# Patient Record
Sex: Male | Born: 1970 | Race: Black or African American | Hispanic: No | Marital: Married | Smoking: Never smoker
Health system: Southern US, Community
[De-identification: ages and names within clinical notes are randomized; demographics above are authoritative.]

## PROBLEM LIST (undated history)

## (undated) DIAGNOSIS — F429 Obsessive-compulsive disorder, unspecified: Secondary | ICD-10-CM

## (undated) DIAGNOSIS — F329 Major depressive disorder, single episode, unspecified: Secondary | ICD-10-CM

## (undated) DIAGNOSIS — F909 Attention-deficit hyperactivity disorder, unspecified type: Secondary | ICD-10-CM

## (undated) DIAGNOSIS — F419 Anxiety disorder, unspecified: Secondary | ICD-10-CM

## (undated) DIAGNOSIS — F32A Depression, unspecified: Secondary | ICD-10-CM

## (undated) DIAGNOSIS — I10 Essential (primary) hypertension: Secondary | ICD-10-CM

## (undated) HISTORY — PX: KNEE SURGERY: SHX244

## (undated) HISTORY — PX: VASECTOMY: SHX75

---

## 2011-05-11 ENCOUNTER — Ambulatory Visit (INDEPENDENT_AMBULATORY_CARE_PROVIDER_SITE_OTHER): Payer: 59 | Admitting: Psychiatry

## 2011-05-11 DIAGNOSIS — F429 Obsessive-compulsive disorder, unspecified: Secondary | ICD-10-CM

## 2011-05-25 ENCOUNTER — Ambulatory Visit (HOSPITAL_COMMUNITY): Payer: 59 | Admitting: Behavioral Health

## 2011-06-01 ENCOUNTER — Encounter (HOSPITAL_COMMUNITY): Payer: 59 | Admitting: Psychiatry

## 2011-08-02 ENCOUNTER — Emergency Department (HOSPITAL_BASED_OUTPATIENT_CLINIC_OR_DEPARTMENT_OTHER)
Admission: EM | Admit: 2011-08-02 | Discharge: 2011-08-03 | Disposition: A | Discharge status: Home / Self Care | Payer: Managed Care, Other (non HMO) | Attending: Emergency Medicine | Admitting: Emergency Medicine

## 2011-08-02 ENCOUNTER — Encounter: Payer: Self-pay | Admitting: *Deleted

## 2011-08-02 DIAGNOSIS — F909 Attention-deficit hyperactivity disorder, unspecified type: Secondary | ICD-10-CM | POA: Insufficient documentation

## 2011-08-02 DIAGNOSIS — J029 Acute pharyngitis, unspecified: Secondary | ICD-10-CM

## 2011-08-02 DIAGNOSIS — F341 Dysthymic disorder: Secondary | ICD-10-CM | POA: Insufficient documentation

## 2011-08-02 DIAGNOSIS — Z79899 Other long term (current) drug therapy: Secondary | ICD-10-CM | POA: Insufficient documentation

## 2011-08-02 HISTORY — DX: Attention-deficit hyperactivity disorder, unspecified type: F90.9

## 2011-08-02 HISTORY — DX: Obsessive-compulsive disorder, unspecified: F42.9

## 2011-08-02 HISTORY — DX: Depression, unspecified: F32.A

## 2011-08-02 HISTORY — DX: Anxiety disorder, unspecified: F41.9

## 2011-08-02 HISTORY — DX: Major depressive disorder, single episode, unspecified: F32.9

## 2011-08-02 NOTE — ED Notes (Signed)
Pt c/o scratchy sore throat since 8pm tonight.  Pt has been on Zoloft for the past couple weeks and started a couple other psych meds approx 35month ago. Pt thinks his sore throat is a reaction from one of his meds.

## 2011-08-03 LAB — RAPID STREP SCREEN (MED CTR MEBANE ONLY): Streptococcus, Group A Screen (Direct): NEGATIVE

## 2011-08-03 NOTE — ED Provider Notes (Signed)
History     CSN: [id number]  Arrival date & time 08/02/11  2242   First MD Initiated Contact with Patient 08/03/11 0002      Chief Complaint  Patient presents with  . Sore Throat    (Consider location/radiation/quality/duration/timing/severity/associated sxs/prior treatment) HPI Comments: Patient is a pleasant 41 year old male with a history of anxiety, obsessive-compulsive disorder who presents with a sore throat which started approximately 4 hours ago. This was gradual in onset, constant pain, mild and not associated with fevers chills nausea vomiting shortness of breath or cough. He denies diarrhea, rashes, swelling of his face or legs. He states that he saw his psychiatrist earlier in the day and told that he was having some restlessness throughout the day because of not taking his medications with food. The restlessness has since stopped. He is concerned because the package insert for Zoloft states that he may have swelling or pain in her throat and should seek medical attention immediately.  Endorses 6 contact with his wife having a flu 10 days ago.  Patient is a 41 y.o. male presenting with pharyngitis. The history is provided by the patient.  Sore Throat Pertinent negatives include no chest pain.    Past Medical History  Diagnosis Date  . Anxiety and depression   . ADHD (attention deficit hyperactivity disorder)   . OCD (obsessive compulsive disorder)     Past Surgical History  Procedure Date  . Knee surgery   . Vasectomy     No family history on file.  History  Substance Use Topics  . Smoking status: Never Smoker   . Smokeless tobacco: Not on file  . Alcohol Use: No      Review of Systems  Constitutional: Negative for fever and chills.  HENT: Positive for sore throat. Negative for congestion, rhinorrhea, trouble swallowing, neck stiffness and voice change.   Respiratory: Negative for cough.   Cardiovascular: Negative for chest pain and leg swelling.    Gastrointestinal: Negative for nausea, vomiting and diarrhea.    Allergies  Review of patient's allergies indicates no known allergies.  Home Medications   Current Outpatient Rx  Name Route Sig Dispense Refill  . DIAZEPAM 5 MG PO TABS Oral Take 5 mg by mouth every 6 (six) hours as needed.      Marland Kitchen ESZOPICLONE 3 MG PO TABS Oral Take 3 mg by mouth at bedtime. Take immediately before bedtime     . GABAPENTIN 300 MG PO CAPS Oral Take 300 mg by mouth 3 (three) times daily.      Marland Kitchen GUANFACINE HCL ER 3 MG PO TB24 Oral Take 1 tablet by mouth 1 day or 1 dose.      Marland Kitchen LAMOTRIGINE 100 MG PO TABS Oral Take 100 mg by mouth daily.      . SERTRALINE HCL 100 MG PO TABS Oral Take 150 mg by mouth daily.        BP 140/85  Pulse 60  Temp(Src) 98.5 F (36.9 C) (Oral)  Resp 18  Ht 6\' 2"  (1.88 m)  Wt 230 lb (104.327 kg)  BMI 29.53 kg/m2  SpO2 99%  Physical Exam  Constitutional: He appears well-developed and well-nourished. No distress.  HENT:  Head: Normocephalic and atraumatic.  Right Ear: External ear normal.  Left Ear: External ear normal.  Mouth/Throat: Oropharynx is clear and moist. No oropharyngeal exudate.       No asymmetry, hypertrophy, exudate, erythema  Eyes: Conjunctivae are normal. Right eye exhibits no discharge. Left eye exhibits  no discharge. No scleral icterus.  Neck: Normal range of motion. Neck supple.  Cardiovascular: Normal rate.   Pulmonary/Chest: Effort normal and breath sounds normal. He has no wheezes. He has no rales.  Abdominal: Soft. There is no tenderness.       No hepatosplenomegaly  Musculoskeletal: He exhibits no edema and no tenderness.  Lymphadenopathy:    He has no cervical adenopathy.  Skin: He is not diaphoretic.    ED Course  Procedures (including critical care time)   Labs Reviewed  RAPID STREP SCREEN   No results found.   1. Pharyngitis       MDM  Patient is well-appearing without signs of pharyngitis or tonsillar abscess, vital signs are  normal with no fever or tachycardia or respiratory distress. Laboratory workup includes a negative rapid strep test.  Recommendations for treatment at home given, doubt Zoloft as reaction but more likely early pharyngitis.        Vida Roller, MD 08/03/11 [phone]

## 2011-12-20 ENCOUNTER — Encounter (HOSPITAL_COMMUNITY): Payer: Self-pay | Admitting: Psychiatry

## 2012-04-25 ENCOUNTER — Encounter (HOSPITAL_BASED_OUTPATIENT_CLINIC_OR_DEPARTMENT_OTHER): Payer: Self-pay | Admitting: *Deleted

## 2012-04-25 ENCOUNTER — Emergency Department (HOSPITAL_BASED_OUTPATIENT_CLINIC_OR_DEPARTMENT_OTHER): Payer: BC Managed Care – PPO

## 2012-04-25 ENCOUNTER — Emergency Department (HOSPITAL_BASED_OUTPATIENT_CLINIC_OR_DEPARTMENT_OTHER)
Admission: EM | Admit: 2012-04-25 | Discharge: 2012-04-26 | Disposition: A | Discharge status: Home / Self Care | Payer: BC Managed Care – PPO | Attending: Emergency Medicine | Admitting: Emergency Medicine

## 2012-04-25 DIAGNOSIS — I10 Essential (primary) hypertension: Secondary | ICD-10-CM | POA: Insufficient documentation

## 2012-04-25 DIAGNOSIS — F429 Obsessive-compulsive disorder, unspecified: Secondary | ICD-10-CM | POA: Insufficient documentation

## 2012-04-25 DIAGNOSIS — F909 Attention-deficit hyperactivity disorder, unspecified type: Secondary | ICD-10-CM | POA: Insufficient documentation

## 2012-04-25 HISTORY — DX: Essential (primary) hypertension: I10

## 2012-04-25 LAB — CBC WITH DIFFERENTIAL/PLATELET
Eosinophils Relative: 2 % (ref 0–5)
HCT: 40.4 % (ref 39.0–52.0)
Hemoglobin: 13.9 g/dL (ref 13.0–17.0)
Lymphocytes Relative: 39 % (ref 12–46)
Lymphs Abs: 1.8 10*3/uL (ref 0.7–4.0)
MCV: 82.8 fL (ref 78.0–100.0)
Monocytes Absolute: 0.3 10*3/uL (ref 0.1–1.0)
Neutro Abs: 2.4 10*3/uL (ref 1.7–7.7)
RBC: 4.88 MIL/uL (ref 4.22–5.81)
WBC: 4.6 10*3/uL (ref 4.0–10.5)

## 2012-04-25 LAB — URINALYSIS, ROUTINE W REFLEX MICROSCOPIC
Bilirubin Urine: NEGATIVE
Glucose, UA: NEGATIVE mg/dL
Ketones, ur: NEGATIVE mg/dL
Leukocytes, UA: NEGATIVE
Nitrite: NEGATIVE
Specific Gravity, Urine: 1.025 (ref 1.005–1.030)
pH: 6.5 (ref 5.0–8.0)

## 2012-04-25 LAB — GLUCOSE, CAPILLARY: Glucose-Capillary: 87 mg/dL (ref 70–99)

## 2012-04-25 MED ORDER — SODIUM CHLORIDE 0.9 % IV BOLUS (SEPSIS)
1000.0000 mL | Freq: Once | INTRAVENOUS | Status: AC
  Administered 2012-04-25: 1000 mL via INTRAVENOUS

## 2012-04-25 MED ORDER — ASPIRIN 81 MG PO CHEW
162.0000 mg | CHEWABLE_TABLET | Freq: Once | ORAL | Status: AC
  Administered 2012-04-25: 162 mg via ORAL
  Filled 2012-04-25: qty 2

## 2012-04-25 MED ORDER — KETOROLAC TROMETHAMINE 30 MG/ML IJ SOLN
30.0000 mg | Freq: Once | INTRAMUSCULAR | Status: AC
  Administered 2012-04-25: 30 mg via INTRAVENOUS
  Filled 2012-04-25: qty 1

## 2012-04-25 MED ORDER — METOCLOPRAMIDE HCL 5 MG/ML IJ SOLN
10.0000 mg | Freq: Once | INTRAMUSCULAR | Status: AC
  Administered 2012-04-25: 10 mg via INTRAVENOUS
  Filled 2012-04-25: qty 2

## 2012-04-25 NOTE — ED Notes (Signed)
Pt recently started on fanapt and since started bp has been elevated, priomary md started pt on medication (Azor) for 2 days just to get blood pressure down until patients body gets adjusted to fanapt.

## 2012-04-25 NOTE — ED Notes (Signed)
States he saw his MD today for headache and elevated blood pressure. He was started on Azor. He has had one pill and blood pressure remains elevated per pt.

## 2012-04-26 LAB — BASIC METABOLIC PANEL
CO2: 25 mEq/L (ref 19–32)
Calcium: 9.5 mg/dL (ref 8.4–10.5)
Chloride: 104 mEq/L (ref 96–112)
Creatinine, Ser: 1.3 mg/dL (ref 0.50–1.35)
Glucose, Bld: 89 mg/dL (ref 70–99)

## 2012-04-26 LAB — TROPONIN I: Troponin I: <0.3 ng/mL (ref <0.30)

## 2012-04-26 MED ORDER — IBUPROFEN 600 MG PO TABS: 600.0000 mg | ORAL_TABLET | Freq: Four times a day (QID) | ORAL | Status: DC | PRN

## 2012-04-26 NOTE — ED Provider Notes (Signed)
History     CSN: [id number]  Arrival date & time 04/25/12  2156   First MD Initiated Contact with Patient 04/25/12 2326      Chief Complaint  Patient presents with  . Headache    (Consider location/radiation/quality/duration/timing/severity/associated sxs/prior treatment) HPI Comments: Pt with hx of HTN and psychiatric conditions comes in with cc of headaches. Pt has no hx of headaches, states that since this morning he has been having a mild to moderate headache, dull, between 2 to 5 out of 10 that has been intermittent, with no specific precipitating factor. The headache is located in the frontal region, and has no specific aggravating or reliebing factors. He has no No nausea, vomiting, visual complains, seizures, altered mental status, loss of consciousness, new weakness, or numbness, no gait instability.  Pt is also having mild chest discomfort, mid sternal, sharp pain. This pain is also intermittent, will last for a few minutes and resolve spontaneously. The pais is worse with exertion, but not always related to exertion. There is no associated n/v/f/c/dizziness/lightheadedness.  Pt saw his pcp earlier today, he is aware of both of his sx. The chest pain is attributed to his new antipsychotic. He also had elevated BP, and was started on new meds.  Pt has HTN as the only risk factor, as he has no family hx of CAD and social hx is benign as well.   Patient is a 41 y.o. male presenting with headaches. The history is provided by the patient.  Headache  Pertinent negatives include no fever and no shortness of breath.    Past Medical History  Diagnosis Date  . Anxiety and depression   . ADHD (attention deficit hyperactivity disorder)   . OCD (obsessive compulsive disorder)   . Hypertension     Past Surgical History  Procedure Date  . Knee surgery   . Vasectomy     No family history on file.  History  Substance Use Topics  . Smoking status: Never Smoker   . Smokeless  tobacco: Not on file  . Alcohol Use: No      Review of Systems  Constitutional: Negative for fever, chills and activity change.  HENT: Negative for neck pain.   Eyes: Negative for visual disturbance.  Respiratory: Positive for chest tightness. Negative for cough and shortness of breath.   Cardiovascular: Negative for chest pain.  Gastrointestinal: Negative for abdominal distention.  Genitourinary: Negative for dysuria, enuresis and difficulty urinating.  Musculoskeletal: Negative for arthralgias.  Neurological: Positive for dizziness and headaches. Negative for light-headedness.  Psychiatric/Behavioral: Negative for confusion.    Allergies  Review of patient's allergies indicates no known allergies.  Home Medications   Current Outpatient Rx  Name Route Sig Dispense Refill  . AZOR PO Oral Take by mouth.    Marland Kitchen FOCALIN PO Oral Take by mouth.    . ILOPERIDONE 4 MG PO TABS Oral Take by mouth.    Marland Kitchen LISINOPRIL PO Oral Take by mouth.    Marland Kitchen DIAZEPAM 5 MG PO TABS Oral Take 5 mg by mouth every 6 (six) hours as needed.      Marland Kitchen ESZOPICLONE 3 MG PO TABS Oral Take 3 mg by mouth at bedtime. Take immediately before bedtime     . GABAPENTIN 300 MG PO CAPS Oral Take 300 mg by mouth 3 (three) times daily.      Marland Kitchen GUANFACINE HCL ER 3 MG PO TB24 Oral Take 1 tablet by mouth 1 day or 1 dose.      Marland Kitchen  IBUPROFEN 600 MG PO TABS Oral Take 1 tablet (600 mg total) by mouth every 6 (six) hours as needed for pain. 30 tablet 0  . LAMOTRIGINE 100 MG PO TABS Oral Take 100 mg by mouth daily.      . SERTRALINE HCL 100 MG PO TABS Oral Take 150 mg by mouth daily.        BP 135/72  Pulse 74  Temp 97.6 F (36.4 C) (Oral)  Resp 20  SpO2 100%  Physical Exam  Nursing note and vitals reviewed. Constitutional: He is oriented to person, place, and time. He appears well-developed.  HENT:  Head: Normocephalic and atraumatic.  Eyes: Conjunctivae normal and EOM are normal. Pupils are equal, round, and reactive to light.    Neck: Normal range of motion. Neck supple.  Cardiovascular: Normal rate, regular rhythm and normal heart sounds.   Pulmonary/Chest: Effort normal and breath sounds normal. No respiratory distress. He has no wheezes.  Abdominal: Soft. Bowel sounds are normal. He exhibits no distension. There is no tenderness. There is no rebound and no guarding.  Musculoskeletal: He exhibits no edema.  Neurological: He is alert and oriented to person, place, and time. No cranial nerve deficit. Coordination normal.  Skin: Skin is warm.    ED Course  Procedures (including critical care time)  Labs Reviewed  BASIC METABOLIC PANEL - Abnormal; Notable for the following:    GFR calc non Af Amer 67 (*)     GFR calc Af Amer 78 (*)     All other components within normal limits  CBC WITH DIFFERENTIAL  TROPONIN I  URINALYSIS, ROUTINE W REFLEX MICROSCOPIC  TROPONIN I  GLUCOSE, CAPILLARY   Dg Chest 2 View  04/26/2012  *RADIOLOGY REPORT*  Clinical Data: Elevated blood pressure and chest tightness.  CHEST - 2 VIEW  Comparison: None.  Findings: Two views of the chest demonstrate clear lungs. Heart and mediastinum are within normal limits.  The trachea is midline. Bony structures are intact.  IMPRESSION: No acute cardiopulmonary disease.   Original Report Authenticated By: Richarda Overlie, M.D.      1. Hypertension       MDM   Date: 04/26/2012  Rate: 57  Rhythm: normal sinus rhythm  QRS Axis: normal  Intervals: normal  ST/T Wave abnormalities: non specific t wave changes, 3, avf  Conduction Disutrbances: none  Narrative Interpretation: unremarkable  Differential diagnosis includes: ACS syndrome CHF exacerbation Valvular disorder Myocarditis Pericarditis Pericardial effusion Pneumonia Pleural effusion Pulmonary edema PE Anemia Musculoskeletal pain Primary headaches - including migrainous headaches, cluster headaches, tension headaches. ICH Carotid dissection Cavernous sinus  thrombosis Meningitis Encephalitis Sinusitis Tumor Vascular headaches AV malformation Brain aneurysm Muscular headaches  Pt comes in with cc of chest pain and headaches.  Chest pain - only 1 cardiac risk factor - HTN. Story at best is stable angina, with pcp aware, and outpatient f/u possible. Will get ACS labs, and ekg, and he will be asked to f/u with pcp if the symptoms persists.  The headaches have no red flags on hx of exam, and there is no thunderclap nature to it, and they are intermittent, moderate headaches. With BP not too elevated, and no hard evidence of ICH/SAH based on hx and exam , no CT ordered and we will not need to get LP.  HTN - outpatient management can be continued. No psych complains.           Derwood Kaplan, MD 04/26/12 [phone]

## 2012-04-26 NOTE — ED Notes (Signed)
I took cbg and got result of 29 mg./dcltr.

## 2013-06-06 IMAGING — CR DG CHEST 2V
2 series · 2 of 2 positions shown · non-contrast
Comparison: None.

CLINICAL DATA: Elevated blood pressure and chest tightness.

CHEST - 2 VIEW

[w chest pa]
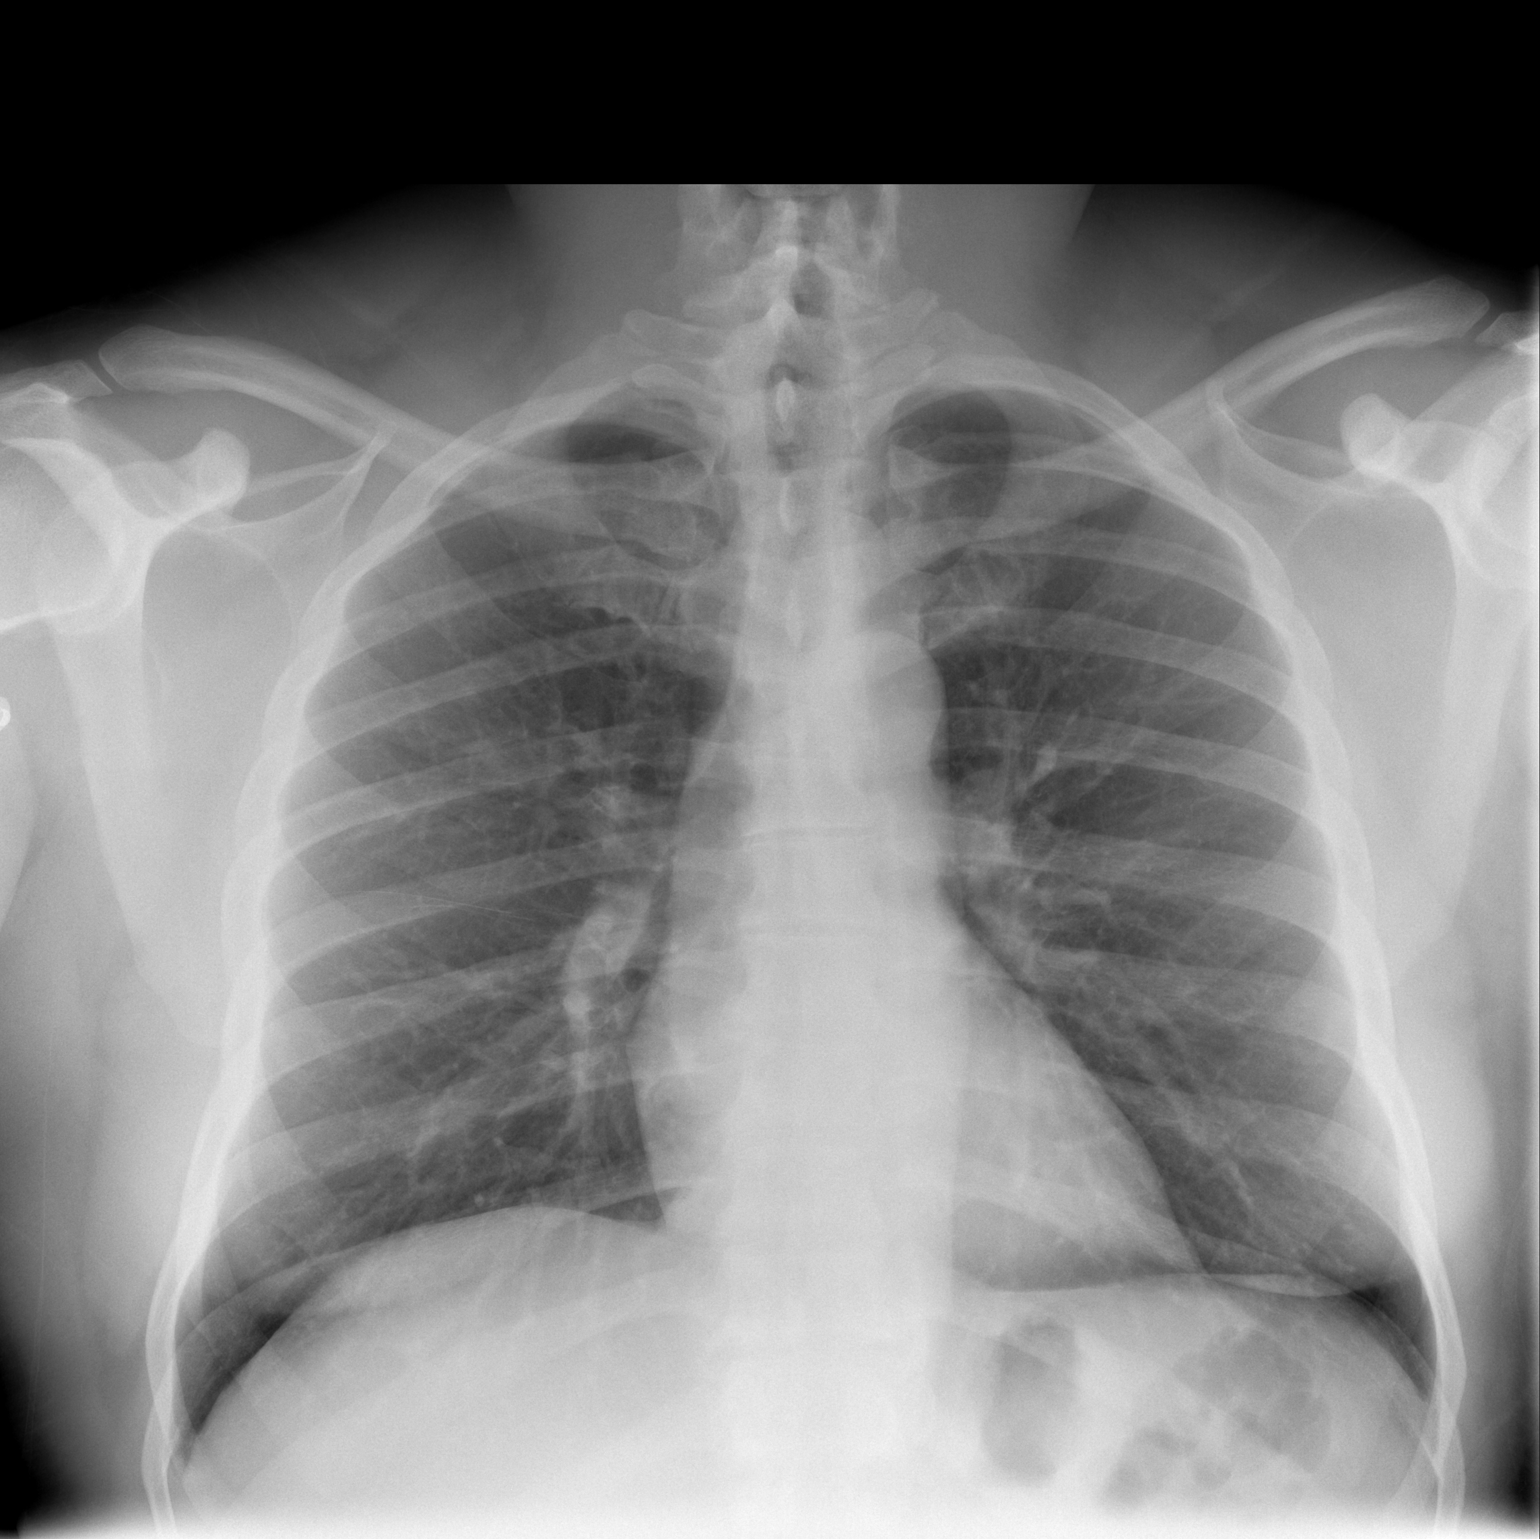

[w chest lat]
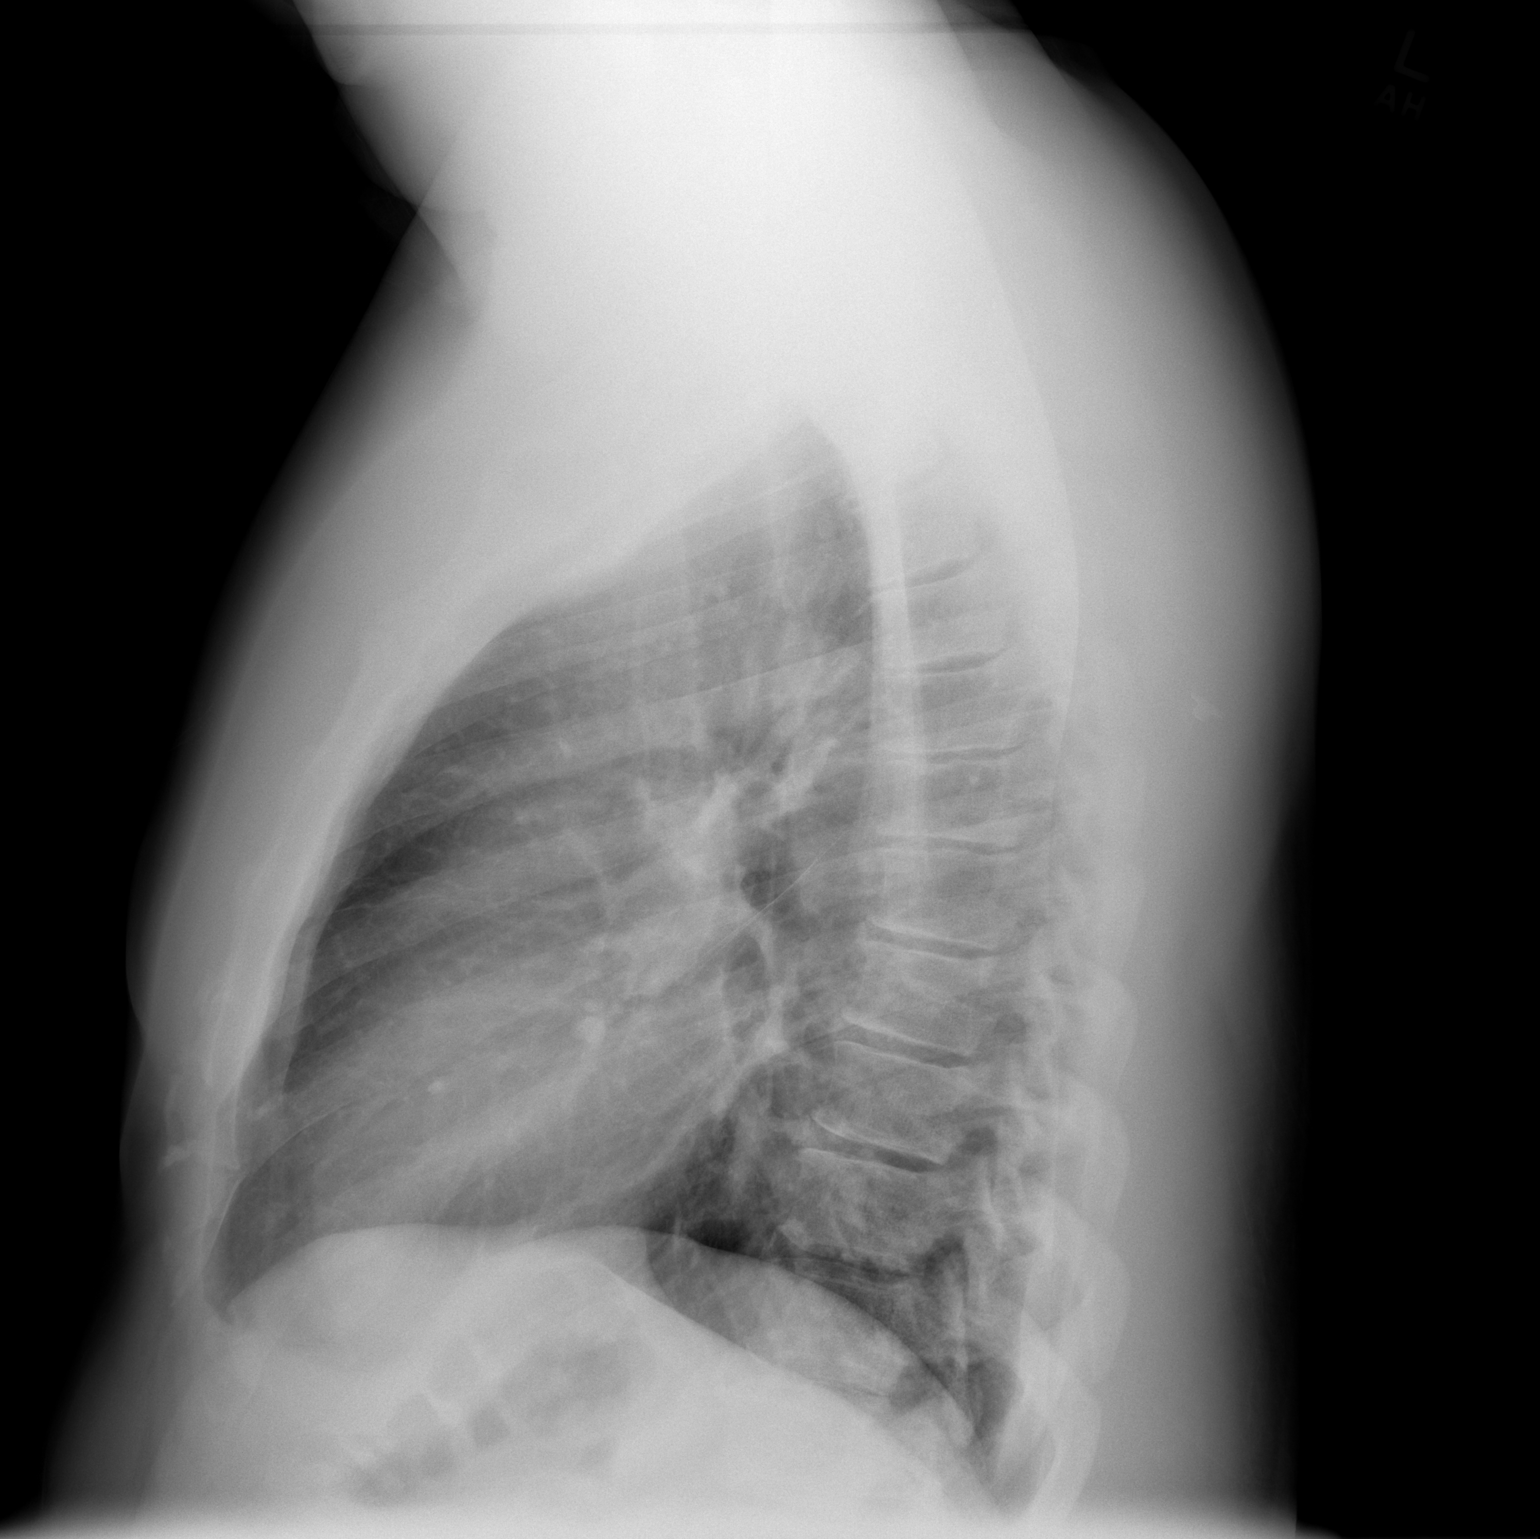

[2 of 2 positions shown; findings below may reference images not displayed]

FINDINGS: Two views of the chest demonstrate clear lungs. Heart and
mediastinum are within normal limits.  The trachea is midline.
Bony structures are intact.
IMPRESSION: No acute cardiopulmonary disease.

## 2016-10-06 ENCOUNTER — Encounter (HOSPITAL_COMMUNITY): Payer: Self-pay | Admitting: Emergency Medicine

## 2016-10-06 ENCOUNTER — Ambulatory Visit (HOSPITAL_COMMUNITY)
Admission: EM | Admit: 2016-10-06 | Discharge: 2016-10-06 | Disposition: A | Discharge status: Home / Self Care | Payer: 59 | Attending: Internal Medicine | Admitting: Internal Medicine

## 2016-10-06 DIAGNOSIS — A549 Gonococcal infection, unspecified: Secondary | ICD-10-CM | POA: Diagnosis not present

## 2016-10-06 DIAGNOSIS — B9689 Other specified bacterial agents as the cause of diseases classified elsewhere: Secondary | ICD-10-CM | POA: Diagnosis not present

## 2016-10-06 DIAGNOSIS — N10 Acute tubulo-interstitial nephritis: Secondary | ICD-10-CM | POA: Diagnosis not present

## 2016-10-06 DIAGNOSIS — R8290 Unspecified abnormal findings in urine: Secondary | ICD-10-CM

## 2016-10-06 DIAGNOSIS — R300 Dysuria: Secondary | ICD-10-CM | POA: Diagnosis present

## 2016-10-06 LAB — POCT URINALYSIS DIP (DEVICE)
Bilirubin Urine: NEGATIVE
GLUCOSE, UA: NEGATIVE mg/dL
Hgb urine dipstick: NEGATIVE
KETONES UR: NEGATIVE mg/dL
Nitrite: NEGATIVE
Protein, ur: NEGATIVE mg/dL
SPECIFIC GRAVITY, URINE: 1.02 (ref 1.005–1.030)
Urobilinogen, UA: 1 mg/dL (ref 0.0–1.0)
pH: 6 (ref 5.0–8.0)

## 2016-10-06 MED ORDER — CIPROFLOXACIN HCL 500 MG PO TABS: 500.0000 mg | ORAL_TABLET | Freq: Two times a day (BID) | ORAL | 0 refills | Status: AC

## 2016-10-06 MED ORDER — PHENAZOPYRIDINE HCL 100 MG PO TABS: 100.0000 mg | ORAL_TABLET | Freq: Three times a day (TID) | ORAL | 0 refills | Status: DC | PRN

## 2016-10-06 NOTE — ED Triage Notes (Signed)
Pt complains of burning with urination for the last four days.  Pt denies any discharge or abdominal pain, but does report some mild lower back pain. Pt denies frequency or urgency and no fever.

## 2016-10-06 NOTE — ED Provider Notes (Signed)
CSN: [id number]     Arrival date & time 10/06/16  1836 History   None    Chief Complaint  Patient presents with  . burning with urination   (Consider location/radiation/quality/duration/timing/severity/associated sxs/prior Treatment) 46 y.o. male presents with pain with urination  X 3 days. Condition is acute, persistent and worsening in nature. Condition is made better by nothing. Condition is made worse by nothing. Patient denies any relief from nothing prior to there arrival at this facility. Patient denies any fever, increased frequency, penile discharge or foul orders. Patient states he has previously had UTI after he has taken a bath. Patient denies any STI encounter but requests testing at this time. Patient made aware that he will be called for treatment if C&S comes back or positive STI.        Past Medical History:  Diagnosis Date  . ADHD (attention deficit hyperactivity disorder)   . Anxiety and depression   . Hypertension   . OCD (obsessive compulsive disorder)    Past Surgical History:  Procedure Laterality Date  . KNEE SURGERY    . VASECTOMY     History reviewed. No pertinent family history. Social History  Substance Use Topics  . Smoking status: Never Smoker  . Smokeless tobacco: Never Used  . Alcohol use No    Review of Systems  Constitutional: Negative for chills and fever.  HENT: Negative for ear pain and sore throat.   Eyes: Negative for pain and visual disturbance.  Respiratory: Negative for cough and shortness of breath.   Cardiovascular: Negative for chest pain and palpitations.  Gastrointestinal: Negative for abdominal pain and vomiting.  Genitourinary: Negative for hematuria.  Musculoskeletal: Negative for arthralgias and back pain.  Skin: Negative for color change and rash.  Neurological: Negative for seizures and syncope.  All other systems reviewed and are negative.   Allergies  Patient has no known allergies.  Home Medications   Prior  to Admission medications   Medication Sig Start Date End Date Taking? Authorizing Provider  Amlodipine-Olmesartan (AZOR PO) Take by mouth.   Yes Historical Provider, MD  DULoxetine (CYMBALTA) 60 MG capsule Take 60 mg by mouth 2 (two) times daily.   Yes Historical Provider, MD  valsartan (DIOVAN) 320 MG tablet Take 320 mg by mouth daily.   Yes Historical Provider, MD  ciprofloxacin (CIPRO) 500 MG tablet Take 1 tablet (500 mg total) by mouth every 12 (twelve) hours. 10/06/16 10/20/16  Alene Mires, NP  Dexmethylphenidate HCl (FOCALIN PO) Take by mouth.    Historical Provider, MD  diazepam (VALIUM) 5 MG tablet Take 5 mg by mouth every 6 (six) hours as needed.      Historical Provider, MD  Eszopiclone 3 MG TABS Take 3 mg by mouth at bedtime. Take immediately before bedtime     Historical Provider, MD  gabapentin (NEURONTIN) 300 MG capsule Take 300 mg by mouth 3 (three) times daily.      Historical Provider, MD  GuanFACINE HCl (INTUNIV) 3 MG TB24 Take 1 tablet by mouth 1 day or 1 dose.      Historical Provider, MD  ibuprofen (ADVIL,MOTRIN) 600 MG tablet Take 1 tablet (600 mg total) by mouth every 6 (six) hours as needed for pain. 04/26/12   Derwood Kaplan, MD  iloperidone (FANAPT) 4 MG TABS Take by mouth.    Historical Provider, MD  lamoTRIgine (LAMICTAL) 100 MG tablet Take 100 mg by mouth daily.      Historical Provider, MD  LISINOPRIL PO Take  by mouth.    Historical Provider, MD  phenazopyridine (PYRIDIUM) 100 MG tablet Take 1 tablet (100 mg total) by mouth 3 (three) times daily as needed for pain. 10/06/16   Alene MiresJennifer C , NP  sertraline (ZOLOFT) 100 MG tablet Take 150 mg by mouth daily.      Historical Provider, MD   Meds Ordered and Administered this Visit  Medications - No data to display  BP 148/92 (BP Location: Right Arm)   Pulse 80   Temp 98.1 F (36.7 C) (Oral)   SpO2 100%  No data found.   Physical Exam  Constitutional: He is oriented to person, place, and time. He  appears well-developed and well-nourished.  HENT:  Head: Normocephalic.  Neck: Normal range of motion.  Pulmonary/Chest: Effort normal.  Musculoskeletal: Normal range of motion.  Neurological: He is alert and oriented to person, place, and time.  Skin: Skin is dry.  Psychiatric: He has a normal mood and affect.  Nursing note and vitals reviewed.   Urgent Care Course     Procedures (including critical care time)  Labs Review Labs Reviewed  POCT URINALYSIS DIP (DEVICE) - Abnormal; Notable for the following:       Result Value   Leukocytes, UA SMALL (*)    All other components within normal limits  URINE CULTURE  URINE CYTOLOGY ANCILLARY ONLY    Imaging Review No results found.          MDM   1. Acute pyelonephritis       Alene MiresJennifer C , NP 10/06/16 1941

## 2016-10-08 LAB — URINE CYTOLOGY ANCILLARY ONLY
Chlamydia: NEGATIVE
NEISSERIA GONORRHEA: POSITIVE — AB
TRICH (WINDOWPATH): NEGATIVE

## 2016-10-09 ENCOUNTER — Ambulatory Visit (HOSPITAL_COMMUNITY)
Admission: EM | Admit: 2016-10-09 | Discharge: 2016-10-09 | Disposition: A | Discharge status: Home / Self Care | Payer: 59 | Attending: Internal Medicine | Admitting: Internal Medicine

## 2016-10-09 ENCOUNTER — Encounter (HOSPITAL_COMMUNITY): Payer: Self-pay | Admitting: Family Medicine

## 2016-10-09 DIAGNOSIS — A549 Gonococcal infection, unspecified: Secondary | ICD-10-CM

## 2016-10-09 MED ORDER — CEFTRIAXONE SODIUM 250 MG IJ SOLR
INTRAMUSCULAR | Status: AC
Start: 2016-10-09 — End: 2016-10-09
  Filled 2016-10-09: qty 250

## 2016-10-09 MED ORDER — CEFTRIAXONE SODIUM 250 MG IJ SOLR
250.0000 mg | Freq: Once | INTRAMUSCULAR | Status: AC
  Administered 2016-10-09: 250 mg via INTRAMUSCULAR

## 2016-10-09 MED ORDER — STERILE WATER FOR INJECTION IJ SOLN
INTRAMUSCULAR | Status: AC
  Filled 2016-10-09: qty 10

## 2016-10-09 MED ORDER — AZITHROMYCIN 250 MG PO TABS
ORAL_TABLET | ORAL | Status: AC
  Filled 2016-10-09: qty 4

## 2016-10-09 MED ORDER — AZITHROMYCIN 250 MG PO TABS
1000.0000 mg | ORAL_TABLET | Freq: Once | ORAL | Status: AC
  Administered 2016-10-09: 1000 mg via ORAL

## 2016-10-09 NOTE — ED Triage Notes (Signed)
Pt here for STD treatment  

## 2016-10-10 LAB — URINE CULTURE

## 2017-01-25 ENCOUNTER — Emergency Department (HOSPITAL_COMMUNITY): Payer: 59

## 2017-01-25 ENCOUNTER — Encounter (HOSPITAL_COMMUNITY): Payer: Self-pay | Admitting: Emergency Medicine

## 2017-01-25 ENCOUNTER — Emergency Department (HOSPITAL_COMMUNITY)
Admission: EM | Admit: 2017-01-25 | Discharge: 2017-01-25 | Disposition: A | Discharge status: Home / Self Care | Payer: 59 | Attending: Emergency Medicine | Admitting: Emergency Medicine

## 2017-01-25 DIAGNOSIS — Y9367 Activity, basketball: Secondary | ICD-10-CM | POA: Diagnosis not present

## 2017-01-25 DIAGNOSIS — Y999 Unspecified external cause status: Secondary | ICD-10-CM | POA: Insufficient documentation

## 2017-01-25 DIAGNOSIS — Y929 Unspecified place or not applicable: Secondary | ICD-10-CM | POA: Diagnosis not present

## 2017-01-25 DIAGNOSIS — F909 Attention-deficit hyperactivity disorder, unspecified type: Secondary | ICD-10-CM | POA: Diagnosis not present

## 2017-01-25 DIAGNOSIS — S63272A Dislocation of unspecified interphalangeal joint of right middle finger, initial encounter: Secondary | ICD-10-CM | POA: Insufficient documentation

## 2017-01-25 DIAGNOSIS — S61212A Laceration without foreign body of right middle finger without damage to nail, initial encounter: Secondary | ICD-10-CM | POA: Diagnosis present

## 2017-01-25 DIAGNOSIS — S63259A Unspecified dislocation of unspecified finger, initial encounter: Secondary | ICD-10-CM

## 2017-01-25 DIAGNOSIS — I10 Essential (primary) hypertension: Secondary | ICD-10-CM | POA: Diagnosis not present

## 2017-01-25 DIAGNOSIS — S61209A Unspecified open wound of unspecified finger without damage to nail, initial encounter: Secondary | ICD-10-CM

## 2017-01-25 DIAGNOSIS — W230XXA Caught, crushed, jammed, or pinched between moving objects, initial encounter: Secondary | ICD-10-CM | POA: Diagnosis not present

## 2017-01-25 DIAGNOSIS — Z79899 Other long term (current) drug therapy: Secondary | ICD-10-CM | POA: Insufficient documentation

## 2017-01-25 MED ORDER — CEFAZOLIN SODIUM-DEXTROSE 1-4 GM/50ML-% IV SOLN
1.0000 g | Freq: Once | INTRAVENOUS | Status: AC
  Administered 2017-01-25: 1 g via INTRAVENOUS
  Filled 2017-01-25: qty 50

## 2017-01-25 MED ORDER — DOXYCYCLINE HYCLATE 100 MG PO CAPS: 100.0000 mg | ORAL_CAPSULE | Freq: Two times a day (BID) | ORAL | 0 refills | Status: AC

## 2017-01-25 MED ORDER — IBUPROFEN 200 MG PO TABS
400.0000 mg | ORAL_TABLET | Freq: Once | ORAL | Status: AC
  Administered 2017-01-25: 400 mg via ORAL
  Filled 2017-01-25: qty 2

## 2017-01-25 MED ORDER — TETANUS-DIPHTH-ACELL PERTUSSIS 5-2.5-18.5 LF-MCG/0.5 IM SUSP
0.5000 mL | Freq: Once | INTRAMUSCULAR | Status: DC
  Filled 2017-01-25: qty 0.5

## 2017-01-25 MED ORDER — BUPIVACAINE HCL 0.5 % IJ SOLN: 50.0000 mL | Freq: Once | INTRAMUSCULAR | Status: DC

## 2017-01-25 MED ORDER — BUPIVACAINE HCL (PF) 0.5 % IJ SOLN: 30.0000 mL | Freq: Once | INTRAMUSCULAR | Status: DC

## 2017-01-25 MED ORDER — ACETAMINOPHEN 325 MG PO TABS
650.0000 mg | ORAL_TABLET | Freq: Once | ORAL | Status: AC
  Administered 2017-01-25: 650 mg via ORAL
  Filled 2017-01-25: qty 2

## 2017-01-25 MED ORDER — LIDOCAINE HCL (PF) 1 % IJ SOLN
30.0000 mL | Freq: Once | INTRAMUSCULAR | Status: DC
  Filled 2017-01-25: qty 30

## 2017-01-25 MED ORDER — HYDROCODONE-ACETAMINOPHEN 5-325 MG PO TABS: ORAL_TABLET | ORAL | 0 refills | Status: DC

## 2017-01-25 NOTE — ED Notes (Signed)
Surgeon in room  °

## 2017-01-25 NOTE — Discharge Instructions (Signed)
Take vicodin for breakthrough pain, do not drink alcohol, drive, care for children or do other critical tasks while taking vicodin.  If you see signs of infection (warmth, redness, tenderness, pus, sharp increase in pain, fever, red streaking) immediately return to the emergency department.

## 2017-01-25 NOTE — ED Notes (Signed)
IV placed on admission and removed now. 22 g left hand.

## 2017-01-25 NOTE — Consult Note (Signed)
Reason for Consult: Right middle finger PIP dislocation Referring Physician: ER staff  Adam Luna is an 46 y.o. male.  HPI: patient presents with a open PIP dislocation.  Patient presents for evaluation and treatment of the of their upper extremity predicament. The patient denies neck, back, chest or  abdominal pain. The patient notes that they have no lower extremity problems. The patients primary complaint is noted. We are planning surgical care pathway for the upper extremity.  Past Medical History:  Diagnosis Date  . ADHD (attention deficit hyperactivity disorder)   . Anxiety and depression   . Hypertension   . OCD (obsessive compulsive disorder)     Past Surgical History:  Procedure Laterality Date  . KNEE SURGERY    . VASECTOMY      No family history on file.  Social History:  reports that he has never smoked. He has never used smokeless tobacco. He reports that he does not drink alcohol or use drugs.  Allergies: No Known Allergies  Medications: I have reviewed the patient's current medications.  No results found for this or any previous visit (from the past 48 hour(s)).  Dg Finger Middle Right  Result Date: 01/25/2017 CLINICAL DATA:  Injury EXAM: RIGHT MIDDLE FINGER 2+V COMPARISON:  None. FINDINGS: There is complete dorsal dislocation of the middle phalanx with respect to the proximal phalanx at the PIP joint. There is associated gas in the soft tissues. No fracture. IMPRESSION: PIP joint dislocation. There is nonspecific gas in the soft tissues. Correlate clinically as for the presence of a soft tissue injury. Electronically Signed   By: Jolaine Click M.D.   On: 01/25/2017 14:31    Review of Systems  Eyes: Negative.   Respiratory: Negative.   Cardiovascular: Negative.   Gastrointestinal: Negative.   Genitourinary: Negative.   Neurological: Negative.    Blood pressure (!) 124/91, pulse (!) 54, temperature 98.2 F (36.8 C), temperature source Oral, resp. rate 18,  height 6\' 3"  (1.905 m), weight 102.1 kg (225 lb), SpO2 100 %. Physical Exam Open right middle finger PIP dislocation with soft tissue trauma ulnarly  Intact refill  The patient is alert and oriented in no acute distress. The patient complains of pain in the affected upper extremity.  The patient is noted to have a normal HEENT exam. Lung fields show equal chest expansion and no shortness of breath. Abdomen exam is nontender without distention. Lower extremity examination does not show any fracture dislocation or blood clot symptoms. Pelvis is stable and the neck and back are stable and nontender.  Assessment/Plan: Open right middle finger PIP dislocation  We are planning surgery for your upper extremity. The risk and benefits of surgery to include risk of bleeding, infection, anesthesia,  damage to normal structures and failure of the surgery to accomplish its intended goals of relieving symptoms and restoring function have been discussed in detail. With this in mind we plan to proceed. I have specifically discussed with the patient the pre-and postoperative regime and the dos and don'ts and risk and benefits in great detail. Risk and benefits of surgery also include risk of dystrophy(CRPS), chronic nerve pain, failure of the healing process to go onto completion and other inherent risks of surgery The relavent the pathophysiology of the disease/injury process, as well as the alternatives for treatment and postoperative course of action has been discussed in great detail with the patient who desires to proceed.  We will do everything in our power to help you (the patient) restore  function to the upper extremity. It is a pleasure to see this patient today.  see full surgical note #[id number]  [name], M 01/25/2017, 6:46 PM

## 2017-01-25 NOTE — ED Triage Notes (Addendum)
Pt verbalizes jammed finger while playing basketball 30 minutes ago and "the bone came out of the skin"; injury to right middle finger.

## 2017-01-25 NOTE — ED Notes (Signed)
Pt did not want any ice for finger injury.

## 2017-01-25 NOTE — ED Provider Notes (Signed)
[facility] Provider Note   CSN: [id number] Arrival date & time: 01/25/17  1351     History   Chief Complaint Chief Complaint  Patient presents with  . Finger Injury      HPI   Blood pressure 118/76, pulse 83, temperature 98.1 F (36.7 C), temperature source Oral, resp. rate 18, height 6\' 3"  (1.905 m), weight 102.1 kg (225 lb), SpO2 100 %.  Adam Luna is a 46 y.o. male complaining of Pain and reduced range of motion with associated laceration to right third digit after jamming it while pain playing basketball just prior to arrival. He states that initially the bone was out of the finger but he push the bone back in underneath the skin. Last tetanus shot is unknown, pain is 7 out of 10. He is right-hand-dominant and does not significantly use his hands for work but he does type of work. He denies numbness or paresthesia. No immunosuppression.   Past Medical History:  Diagnosis Date  . ADHD (attention deficit hyperactivity disorder)   . Anxiety and depression   . Hypertension   . OCD (obsessive compulsive disorder)     There are no active problems to display for this patient.   Past Surgical History:  Procedure Laterality Date  . KNEE SURGERY    . VASECTOMY         Home Medications    Prior to Admission medications   Medication Sig Start Date End Date Taking? Authorizing Provider  Amlodipine-Olmesartan (AZOR PO) Take by mouth.    [provider]  Dexmethylphenidate HCl (FOCALIN PO) Take by mouth.    [provider]  diazepam (VALIUM) 5 MG tablet Take 5 mg by mouth every 6 (six) hours as needed.      [provider]  doxycycline (VIBRAMYCIN) 100 MG capsule Take 1 capsule (100 mg total) by mouth 2 (two) times daily. 01/25/17 02/04/17  , Joni Reining, PA-C  DULoxetine (CYMBALTA) 60 MG capsule Take 60 mg by mouth 2 (two) times daily.    [provider]  Eszopiclone 3 MG TABS Take 3 mg by mouth at bedtime. Take immediately  before bedtime     [provider]  gabapentin (NEURONTIN) 300 MG capsule Take 300 mg by mouth 3 (three) times daily.      [provider]  GuanFACINE HCl (INTUNIV) 3 MG TB24 Take 1 tablet by mouth 1 day or 1 dose.      [provider]  HYDROcodone-acetaminophen (NORCO/VICODIN) 5-325 MG tablet Take 1-2 tablets by mouth every 6 hours as needed for pain. 01/25/17   , Joni Reining, PA-C  ibuprofen (ADVIL,MOTRIN) 600 MG tablet Take 1 tablet (600 mg total) by mouth every 6 (six) hours as needed for pain. 04/26/12   Derwood Kaplan, MD  iloperidone (FANAPT) 4 MG TABS Take by mouth.    [provider]  lamoTRIgine (LAMICTAL) 100 MG tablet Take 100 mg by mouth daily.      [provider]  LISINOPRIL PO Take by mouth.    [provider]  phenazopyridine (PYRIDIUM) 100 MG tablet Take 1 tablet (100 mg total) by mouth 3 (three) times daily as needed for pain. 10/06/16   Alene Mires, NP  sertraline (ZOLOFT) 100 MG tablet Take 150 mg by mouth daily.      [provider]  valsartan (DIOVAN) 320 MG tablet Take 320 mg by mouth daily.    [provider]    Family History No family history on file.  Social History Social History  Substance Use Topics  . Smoking status: Never Smoker  . Smokeless tobacco: Never Used  . Alcohol use No     Allergies   Patient has no known allergies.   Review of Systems Review of Systems  A complete review of systems was obtained and all systems are negative except as noted in the HPI and PMH.    Physical Exam Updated Vital Signs BP (!) 133/96   Pulse 98   Temp 98.1 F (36.7 C) (Oral)   Resp 16   Ht 6\' 3"  (1.905 m)   Wt 102.1 kg (225 lb)   SpO2 100%   BMI 28.12 kg/m   Physical Exam  Constitutional: He is oriented to person, place, and time. He appears well-developed and well-nourished. No distress.  HENT:  Head: Normocephalic and atraumatic.  Mouth/Throat: Oropharynx is  clear and moist.  Eyes: Conjunctivae and EOM are normal. Pupils are equal, round, and reactive to light.  Neck: Normal range of motion.  Cardiovascular: Normal rate, regular rhythm and intact distal pulses.   Pulmonary/Chest: Effort normal and breath sounds normal.  Abdominal: Soft. There is no tenderness.  Musculoskeletal:  1.5 cm stellate laceration on the ulnar aspect of the right third digit PIP. No bone exposed. Obvious deformity, distally neurovascularly intact.  Neurological: He is alert and oriented to person, place, and time.  Skin: He is not diaphoretic.  Psychiatric: He has a normal mood and affect.  Nursing note and vitals reviewed.        ED Treatments / Results  Labs (all labs ordered are listed, but only abnormal results are displayed) Labs Reviewed - No data to display  EKG  EKG Interpretation None       Radiology Dg Finger Middle Right  Result Date: 01/25/2017 CLINICAL DATA:  Injury EXAM: RIGHT MIDDLE FINGER 2+V COMPARISON:  None. FINDINGS: There is complete dorsal dislocation of the middle phalanx with respect to the proximal phalanx at the PIP joint. There is associated gas in the soft tissues. No fracture. IMPRESSION: PIP joint dislocation. There is nonspecific gas in the soft tissues. Correlate clinically as for the presence of a soft tissue injury. Electronically Signed   By: Jolaine Click M.D.   On: 01/25/2017 14:31    Procedures Procedures (including critical care time)  Medications Ordered in ED Medications  Tdap (BOOSTRIX) injection 0.5 mL (0.5 mLs Intramuscular Not Given 01/25/17 1609)  lidocaine (PF) (XYLOCAINE) 1 % injection 30 mL (not administered)  ceFAZolin (ANCEF) IVPB 1 g/50 mL premix (0 g Intravenous Stopped 01/25/17 1707)     Initial Impression / Assessment and Plan / ED Course  I have reviewed the triage vital signs and the nursing notes.  Pertinent labs & imaging results that were available during my care of the patient were  reviewed by me and considered in my medical decision making (see chart for details).     Vitals:   01/25/17 1409 01/25/17 1528 01/25/17 1609  BP: 118/76 (!) 132/103 (!) 133/96  Pulse: 83 (!) 59 98  Resp: 18 14 16   Temp: 98.1 F (36.7 C)    TempSrc: Oral    SpO2: 100% 100% 100%  Weight: 102.1 kg (225 lb)    Height: 6\' 3"  (1.905 m)      Medications  Tdap (BOOSTRIX) injection 0.5 mL (0.5 mLs Intramuscular Not Given 01/25/17 1609)  lidocaine (PF) (XYLOCAINE) 1 % injection 30 mL (not administered)  ceFAZolin (ANCEF) IVPB 1 g/50 mL premix (0 g  Intravenous Stopped 01/25/17 1707)    Adam Luna is 46 y.o. male presenting with Dislocation to the right third PIP. This was open. He self reduced the bone underneath the skin but did not reduce the dislocation. Patient otherwise healthy, distally Norvasc and intact. Patient given Ancef and will discuss with Dr. Butler DenmarkGrammig.  Dr. Butler DenmarkGrammig and would like to perform the reduction and manage this wound primarily. He will come to the ED. Patient discharged with 40 Vicodin at Dr. Butler DenmarkGrammig makes request.  Evaluation does not show pathology that would require ongoing emergent intervention or inpatient treatment. Pt is hemodynamically stable and mentating appropriately. Discussed findings and plan with patient/guardian, who agrees with care plan. All questions answered. Return precautions discussed and outpatient follow up given.     Final Clinical Impressions(s) / ED Diagnoses   Final diagnoses:  Open dislocation of phalanx of hand, initial encounter    New Prescriptions New Prescriptions   DOXYCYCLINE (VIBRAMYCIN) 100 MG CAPSULE    Take 1 capsule (100 mg total) by mouth 2 (two) times daily.   HYDROCODONE-ACETAMINOPHEN (NORCO/VICODIN) 5-325 MG TABLET    Take 1-2 tablets by mouth every 6 hours as needed for pain.     Wynetta Emeryisciotta, , Cordelia Poche-C 01/25/17 1739    Alvira MondaySchlossman, Erin, MD 01/26/17 [phone]

## 2017-01-28 NOTE — Op Note (Signed)
[name]:  Greek, Wladyslaw                ACCOUNT NO.:  [id number]  MEDICAL RECORD NO.:  [id number]  LOCATION:                                 FACILITY:  PHYSICIAN:  Dionne AnoWilliam M. Amanda PeaGramig, M.D.     DATE OF BIRTH:  DATE OF PROCEDURE:  01/25/2017 DATE OF DISCHARGE:                              OPERATIVE REPORT   PREOPERATIVE DIAGNOSIS:  Right middle finger open proximal interphalangeal dislocation.  POSTOPERATIVE DIAGNOSIS:  Right middle finger open proximal interphalangeal dislocation.  PROCEDURE: 1. Irrigation and debridement, open PIP dislocation, including skin,     subcutaneous tissue, and bone. 2. Ulnar digital nerve neurolysis extensive under 4.5 expanded loupe     magnification. 3. Open treatment, PIP dislocation, right middle finger. 4. 4 view radiographic series performed, examined, and interpreted by     myself; right middle finger.  SURGEON:  Dionne AnoWilliam M. Amanda PeaGramig, M.D.  ASSISTANT:  None.  COMPLICATIONS:  None.  ANESTHESIA:  Intermetacarpal block.  DESCRIPTION OF PROCEDURE:  The patient was taken to the procedure suite and consented, underwent smooth induction of intermetacarpal block by myself.  Following this, he was prepped and draped with 2 separate Betadine scrub and paints.  I then performed isolation of sterile field. Time-out was observed and the patient underwent irrigation and debridement of skin, subcutaneous tissue, bone, tendon, and associated soft tissue structure.  He had injury to the A3 pulley and I resected portions of the A3 pulley.  I irrigated with greater than 2 L of saline.  Following this, I then very carefully and cautiously performed ulnar digital nerve neurolysis.  This nerve was intact without abnormality, but was significantly contused and bruised.  Once this was completed, I then performed open relocation of the joint. The joint sat well.  The volar plate was stable, but disrupted.  The patient demonstrated full range of motion.  I was  pleased with this and the findings.  I brought in x-ray and performed 4 view radiographic series performed, examined, and interpreted by myself, which looked excellent.  I was pleased with this and the findings.  Following this, we irrigated additionally and closed the wound with chromic suture.  I placed him in a sterile dressing of Adaptic, Xeroform, and following this placed a dorsal blocking splint.  He tolerated this well.  There were no complicating features.  Following placement of the splint, I took final x-rays.  He tolerated the procedure well.  Per my instruction, he will be on Cipro 500 mg 1 p.o. b.i.d. x10 days. I have discussed with him elevation, range of motion, and prevention of hyperextension to the digit.  I would like to see him in 8-10 days and have a therapy appointment immediately following.  He was given Norco p.r.n. pain.  These notes were discussed and all questions encouraged and answered.  I did ask that he receive an additional 1 g of Ancef in addition to the 1 g he received preoperatively.  It was a pleasure to see him today, I wish him best for the future.     Dionne AnoWilliam M. Amanda PeaGramig, M.D.   ______________________________ Dionne AnoWilliam M. Amanda PeaGramig, M.D.    [facility]WMG/MEDQ  D:  01/25/2017  T:  01/25/2017  Job:  [id number]

## 2018-03-08 IMAGING — DX DG FINGER MIDDLE 2+V*R*
3 series · 3 of 3 positions shown · non-contrast
Comparison: None.

CLINICAL DATA: Injury

EXAM:
RIGHT MIDDLE FINGER 2+V

[finger ap]
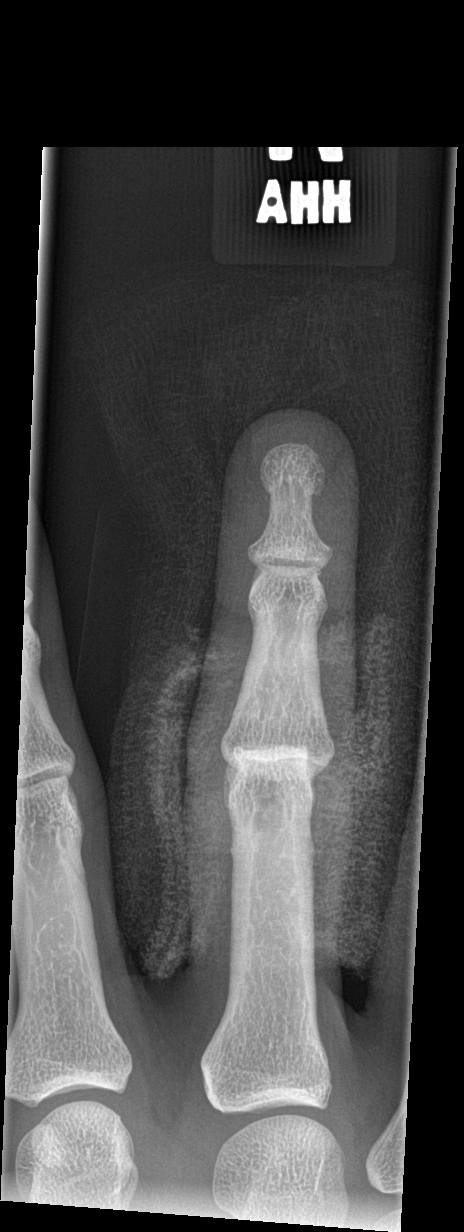

[finger obl]
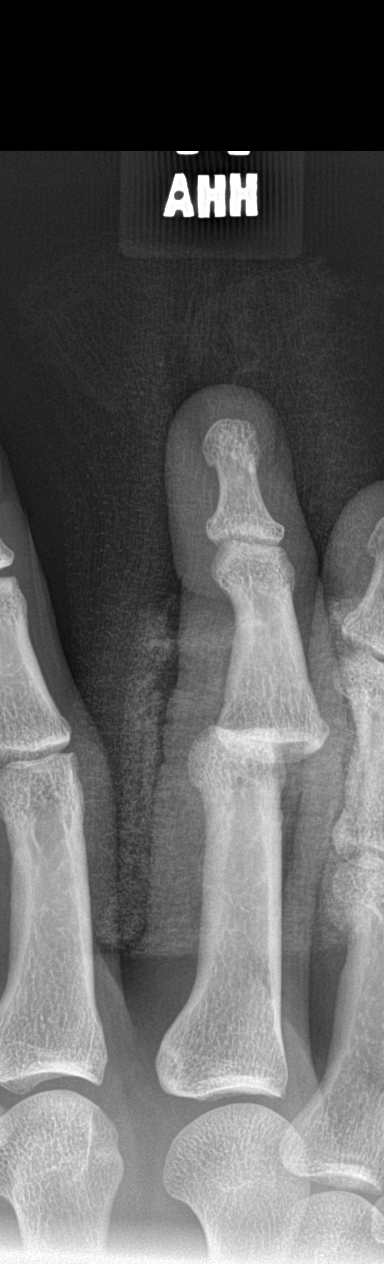

[finger lat]
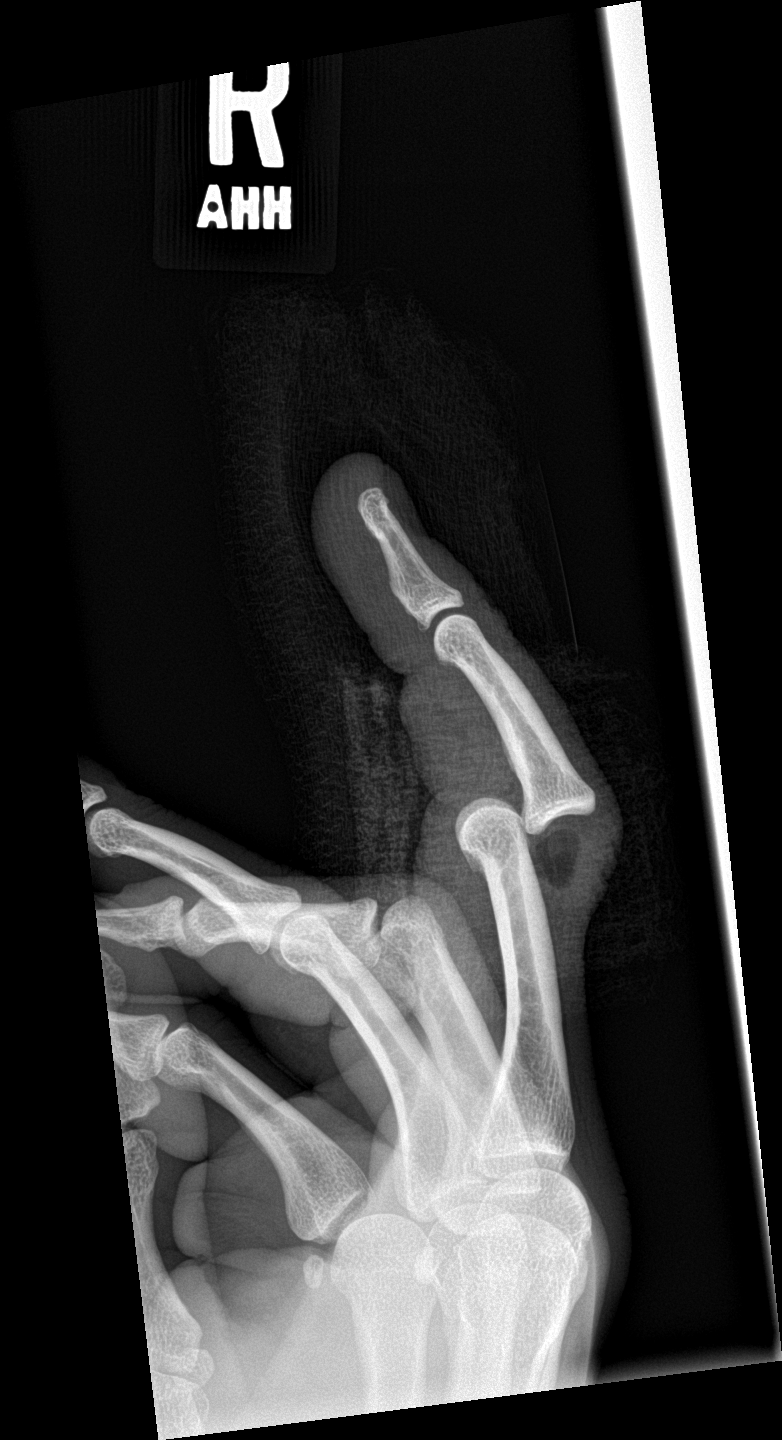

[3 of 3 positions shown; findings below may reference images not displayed]

FINDINGS: There is complete dorsal dislocation of the middle phalanx with
respect to the proximal phalanx at the PIP joint. There is
associated gas in the soft tissues. No fracture.
IMPRESSION: PIP joint dislocation. There is nonspecific gas in the soft tissues.
Correlate clinically as for the presence of a soft tissue injury.

## 2019-07-25 ENCOUNTER — Emergency Department (HOSPITAL_BASED_OUTPATIENT_CLINIC_OR_DEPARTMENT_OTHER): Admission: EM | Admit: 2019-07-25 | Discharge: 2019-07-25 | Discharge status: Absent without leave | Payer: 59

## 2019-07-27 ENCOUNTER — Ambulatory Visit: Payer: Managed Care, Other (non HMO) | Attending: Internal Medicine

## 2019-07-27 DIAGNOSIS — Z20822 Contact with and (suspected) exposure to covid-19: Secondary | ICD-10-CM

## 2019-07-29 LAB — NOVEL CORONAVIRUS, NAA: SARS-CoV-2, NAA: NOT DETECTED

## 2021-12-06 ENCOUNTER — Encounter (HOSPITAL_COMMUNITY): Payer: Self-pay

## 2021-12-06 ENCOUNTER — Ambulatory Visit (HOSPITAL_COMMUNITY)
Admission: EM | Admit: 2021-12-06 | Discharge: 2021-12-06 | Disposition: A | Discharge status: Home / Self Care | Payer: 59 | Attending: Internal Medicine | Admitting: Internal Medicine

## 2021-12-06 DIAGNOSIS — R300 Dysuria: Secondary | ICD-10-CM | POA: Diagnosis present

## 2021-12-06 DIAGNOSIS — Z113 Encounter for screening for infections with a predominantly sexual mode of transmission: Secondary | ICD-10-CM

## 2021-12-06 LAB — POCT URINALYSIS DIPSTICK, ED / UC
Bilirubin Urine: NEGATIVE
Glucose, UA: NEGATIVE mg/dL
Hgb urine dipstick: NEGATIVE
Ketones, ur: NEGATIVE mg/dL
Leukocytes,Ua: NEGATIVE
Nitrite: NEGATIVE
Protein, ur: NEGATIVE mg/dL
Specific Gravity, Urine: 1.02 (ref 1.005–1.030)
Urobilinogen, UA: 1 mg/dL (ref 0.0–1.0)
pH: 8.5 — ABNORMAL HIGH (ref 5.0–8.0)

## 2021-12-06 NOTE — ED Provider Notes (Signed)
?[facility] ? ? ? ?CSN: [id number] ?Arrival date & time: 12/06/21  1724 ? ? ?  ? ?History   ?Chief Complaint ?Chief Complaint  ?Patient presents with  ? Dysuria  ? ? ?HPI ?Adam Luna is a 51 y.o. male.  ? ?Patient presents with urinary burning that started approximately 2 days ago.  Denies urinary frequency, penile discharge, hematuria, testicular pain, abdominal pain, back pain, fever.  Denies any known exposure to STD but has had unprotected sexual intercourse. ? ? ?Dysuria ? ?Past Medical History:  ?Diagnosis Date  ? ADHD (attention deficit hyperactivity disorder)   ? Anxiety and depression   ? Hypertension   ? OCD (obsessive compulsive disorder)   ? ? ?There are no problems to display for this patient. ? ? ?Past Surgical History:  ?Procedure Laterality Date  ? KNEE SURGERY    ? VASECTOMY    ? ? ? ? ? ?Home Medications   ? ?Prior to Admission medications   ?Medication Sig Start Date End Date Taking? Authorizing Provider  ?Amlodipine-Olmesartan (AZOR PO) Take by mouth.    [provider]  ?Dexmethylphenidate HCl (FOCALIN PO) Take by mouth.    [provider]  ?diazepam (VALIUM) 5 MG tablet Take 5 mg by mouth every 6 (six) hours as needed.      [provider]  ?DULoxetine (CYMBALTA) 60 MG capsule Take 60 mg by mouth 2 (two) times daily.    [provider]  ?Eszopiclone 3 MG TABS Take 3 mg by mouth at bedtime. Take immediately before bedtime     [provider]  ?gabapentin (NEURONTIN) 300 MG capsule Take 300 mg by mouth 3 (three) times daily.      [provider]  ?GuanFACINE HCl (INTUNIV) 3 MG TB24 Take 1 tablet by mouth 1 day or 1 dose.      [provider]  ?HYDROcodone-acetaminophen (NORCO/VICODIN) 5-325 MG tablet Take 1-2 tablets by mouth every 6 hours as needed for pain. 01/25/17   Pisciotta, Joni ReiningNicole, PA-C  ?ibuprofen (ADVIL,MOTRIN) 600 MG tablet Take 1 tablet (600 mg total) by mouth every 6 (six) hours as needed for pain. 04/26/12    Derwood KaplanNanavati, Ankit, MD  ?iloperidone (FANAPT) 4 MG TABS Take by mouth.    [provider]  ?lamoTRIgine (LAMICTAL) 100 MG tablet Take 100 mg by mouth daily.      [provider]  ?LISINOPRIL PO Take by mouth.    [provider]  ?phenazopyridine (PYRIDIUM) 100 MG tablet Take 1 tablet (100 mg total) by mouth 3 (three) times daily as needed for pain. 10/06/16   Alene Miresmohundro, Jennifer C, NP  ?sertraline (ZOLOFT) 100 MG tablet Take 150 mg by mouth daily.      [provider]  ?valsartan (DIOVAN) 320 MG tablet Take 320 mg by mouth daily.    [provider]  ? ? ?Family History ?Family History  ?Problem Relation Age of Onset  ? Hypertension Father   ? ? ?Social History ?Social History  ? ?Tobacco Use  ? Smoking status: Never  ? Smokeless tobacco: Never  ?Substance Use Topics  ? Alcohol use: No  ? Drug use: No  ? ? ? ?Allergies   ?Omeprazole ? ? ?Review of Systems ?Review of Systems ?Per HPI ? ?Physical Exam ?Triage Vital Signs ?ED Triage Vitals  ?Enc Vitals Group  ?   BP 12/06/21 1830 (!) 144/91  ?   Pulse Rate 12/06/21 1830 79  ?   Resp 12/06/21 1830 18  ?  Temp 12/06/21 1830 98 ?F (36.7 ?C)  ?   Temp Source 12/06/21 1830 Oral  ?   SpO2 12/06/21 1830 97 %  ?   Weight --   ?   Height --   ?   Head Circumference --   ?   Peak Flow --   ?   Pain Score 12/06/21 1829 0  ?   Pain Loc --   ?   Pain Edu? --   ?   Excl. in GC? --   ? ?No data found. ? ?Updated Vital Signs ?BP (!) 144/91 (BP Location: Right Arm)   Pulse 79   Temp 98 ?F (36.7 ?C) (Oral)   Resp 18   SpO2 97%  ? ?Visual Acuity ?Right Eye Distance:   ?Left Eye Distance:   ?Bilateral Distance:   ? ?Right Eye Near:   ?Left Eye Near:    ?Bilateral Near:    ? ?Physical Exam ?Constitutional:   ?   General: He is not in acute distress. ?   Appearance: Normal appearance. He is not toxic-appearing or diaphoretic.  ?HENT:  ?   Head: Normocephalic and atraumatic.  ?Eyes:  ?   Extraocular Movements: Extraocular movements intact.  ?    Conjunctiva/sclera: Conjunctivae normal.  ?Cardiovascular:  ?   Rate and Rhythm: Normal rate and regular rhythm.  ?   Pulses: Normal pulses.  ?   Heart sounds: Normal heart sounds.  ?Pulmonary:  ?   Effort: Pulmonary effort is normal. No respiratory distress.  ?   Breath sounds: Normal breath sounds.  ?Genitourinary: ?   Comments: Deferred with shared decision making.  Self swab performed. ?Neurological:  ?   General: No focal deficit present.  ?   Mental Status: He is alert and oriented to person, place, and time. Mental status is at baseline.  ?Psychiatric:     ?   Mood and Affect: Mood normal.     ?   Behavior: Behavior normal.     ?   Thought Content: Thought content normal.     ?   Judgment: Judgment normal.  ? ? ? ?UC Treatments / Results  ?Labs ?(all labs ordered are listed, but only abnormal results are displayed) ?Labs Reviewed  ?POCT URINALYSIS DIPSTICK, ED / UC - Abnormal; Notable for the following components:  ?    Result Value  ? pH 8.5 (*)   ? All other components within normal limits  ?URINE CULTURE  ?CYTOLOGY, (ORAL, ANAL, URETHRAL) ANCILLARY ONLY  ? ? ?EKG ? ? ?Radiology ?No results found. ? ?Procedures ?Procedures (including critical care time) ? ?Medications Ordered in UC ?Medications - No data to display ? ?Initial Impression / Assessment and Plan / UC Course  ?I have reviewed the triage vital signs and the nursing notes. ? ?Pertinent labs & imaging results that were available during my care of the patient were reviewed by me and considered in my medical decision making (see chart for details). ? ?  ? ?Urinalysis not indicating urinary tract infection.  Will send urine culture given that dysuria is present.  Highly suspicious that STD could be present so cytology swab is pending.  Will await results for any further treatment.  Low suspicion for kidney stone.  Patient to refrain from sexual activity until test results and treatment are complete.  Discussed return precautions.  Patient verbalized  understanding and was agreeable with plan. ?Final Clinical Impressions(s) / UC Diagnoses  ? ?Final diagnoses:  ?Dysuria  ?Screening examination  for venereal disease  ? ? ? ?Discharge Instructions   ? ?  ?Your urine test was negative.  Your penile swab and urine culture are pending.  We will call if they are abnormal and treat as appropriate.  Please refrain from sexual activity until test results and treatment are complete. ? ? ? ?ED Prescriptions   ?None ?  ? ?PDMP not reviewed this encounter. ?  ?Gustavus Bryant, [place] ?12/06/21 1858 ? ?

## 2021-12-06 NOTE — ED Triage Notes (Signed)
2 day h/o dysuria. No meds taken. ?Denies hematuria, penile discharge, urinary frequency and urgency.  ?

## 2021-12-06 NOTE — Discharge Instructions (Signed)
Your urine test was negative.  Your penile swab and urine culture are pending.  We will call if they are abnormal and treat as appropriate.  Please refrain from sexual activity until test results and treatment are complete. ?

## 2021-12-07 LAB — CYTOLOGY, (ORAL, ANAL, URETHRAL) ANCILLARY ONLY
Chlamydia: POSITIVE — AB
Comment: NEGATIVE
Comment: NEGATIVE
Comment: NORMAL
Neisseria Gonorrhea: NEGATIVE
Trichomonas: NEGATIVE

## 2021-12-08 ENCOUNTER — Telehealth (HOSPITAL_COMMUNITY): Payer: Self-pay | Admitting: Emergency Medicine

## 2021-12-08 LAB — URINE CULTURE: Culture: NO GROWTH

## 2021-12-08 MED ORDER — DOXYCYCLINE HYCLATE 100 MG PO CAPS: 100.0000 mg | ORAL_CAPSULE | Freq: Two times a day (BID) | ORAL | 0 refills | Status: AC

## 2023-01-17 ENCOUNTER — Telehealth: Payer: Self-pay

## 2023-01-17 ENCOUNTER — Other Ambulatory Visit (HOSPITAL_COMMUNITY): Payer: Self-pay

## 2023-01-17 NOTE — Telephone Encounter (Signed)
[facility] Patient Product/process development scientist completed.    The patient is insured through [employer] and has a $1100.00 copay.  Patient will need a copay card.  We will continue to follow to see if copay assistance is needed.  Clearance Coots, CPhT Specialty Pharmacy Patient [facility] for Infectious Disease Phone: [phone] Fax:  [phone]

## 2023-01-22 ENCOUNTER — Other Ambulatory Visit: Payer: Self-pay | Admitting: Pharmacist

## 2023-01-25 ENCOUNTER — Ambulatory Visit (INDEPENDENT_AMBULATORY_CARE_PROVIDER_SITE_OTHER): Payer: 59 | Admitting: Pharmacist

## 2023-01-25 ENCOUNTER — Telehealth: Payer: Self-pay

## 2023-01-25 ENCOUNTER — Other Ambulatory Visit (HOSPITAL_COMMUNITY): Payer: Self-pay

## 2023-01-25 ENCOUNTER — Other Ambulatory Visit: Payer: Self-pay

## 2023-01-25 DIAGNOSIS — Z2981 Encounter for HIV pre-exposure prophylaxis: Secondary | ICD-10-CM | POA: Diagnosis not present

## 2023-01-25 DIAGNOSIS — Z23 Encounter for immunization: Secondary | ICD-10-CM

## 2023-01-25 DIAGNOSIS — Z79899 Other long term (current) drug therapy: Secondary | ICD-10-CM

## 2023-01-25 MED ORDER — CABOTEGRAVIR ER 600 MG/3ML IM SUER
600.0000 mg | Freq: Once | INTRAMUSCULAR | Status: AC
  Administered 2023-01-25: 600 mg via INTRAMUSCULAR

## 2023-01-25 MED ORDER — APRETUDE 600 MG/3ML IM SUER: 600.0000 mg | INTRAMUSCULAR | 0 refills | Status: DC

## 2023-01-25 NOTE — Progress Notes (Signed)
I have evaluated the chart below and refer this patient to the CPP clinic for medication management, adjustment, and treatment.   Rexene Alberts, MSN, NP-C [facility] for Infectious Disease [facility]  [email or web address] Pager: [phone] Office: [phone] [facility] Main Line: [phone] *Secure Chat Communication Welcome

## 2023-01-25 NOTE — Telephone Encounter (Signed)
[facility] Patient Advocate Encounter   Was successful in Enrolling Patient in [employer] Portal for [employer].   I have spoken with the patient.           Clearance Coots, CPhT Specialty Pharmacy Patient [facility] for Infectious Disease Phone: [phone] Fax:  [phone]

## 2023-01-25 NOTE — Telephone Encounter (Signed)
Pharmacy Patient Advocate Encounter- [employer] BIV-Medical Benefit:  J code: N8295 CPT code: 62130  Dx Code: Z20.6  PA was submitted to [facility] and has been approved through: 01/25/23-01/25/24 Authorization# [id number]

## 2023-01-25 NOTE — Progress Notes (Signed)
NEW REFERRAL TO CPP CLINIC    HPI: Adam Luna is a 52 y.o. male who presents to the [facility] pharmacy clinic for Apretude administration and HIV PrEP follow up.  There are no problems to display for this patient.   Patient's Medications  New Prescriptions   CABOTEGRAVIR ER (APRETUDE) 600 MG/3ML INJECTION    Inject 3 mLs (600 mg total) into the muscle every 2 (two) months.  Previous Medications   AMLODIPINE (NORVASC) 10 MG TABLET    Take 10 mg by mouth daily.   DULOXETINE (CYMBALTA) 60 MG CAPSULE    Take 60 mg by mouth 2 (two) times daily.   ESZOPICLONE 3 MG TABS    Take 3 mg by mouth at bedtime. Take immediately before bedtime    OMEGA-3 FATTY ACIDS (OMEGA III EPA+DHA) 1000 MG CAPS    Take by mouth.   VALSARTAN-HYDROCHLOROTHIAZIDE (DIOVAN-HCT) 320-25 MG TABLET    Take 1 tablet by mouth daily.  Modified Medications   No medications on file  Discontinued Medications   DESCOVY 200-25 MG TABLET    Take 1 tablet by mouth daily.    Allergies: Allergies  Allergen Reactions   Omeprazole Anxiety    Past Medical History: Past Medical History:  Diagnosis Date   ADHD (attention deficit hyperactivity disorder)    Anxiety and depression    Hypertension    OCD (obsessive compulsive disorder)     Social History: Social History   Socioeconomic History   Marital status: Married    Spouse name: Not on file   Number of children: Not on file   Years of education: Not on file   Highest education level: Not on file  Occupational History   Not on file  Tobacco Use   Smoking status: Never   Smokeless tobacco: Never  Substance and Sexual Activity   Alcohol use: No   Drug use: No   Sexual activity: Yes  Other Topics Concern   Not on file  Social History Narrative   Not on file   Social Determinants of Health   Financial Resource Strain: Not on file  Food Insecurity: Not on file  Transportation Needs: Not on file  Physical Activity: Not on file  Stress: Not on file  Social  Connections: Not on file    Labs: No results found for: "HIV1RNAQUANT", "HIV1RNAVL", "CD4TABS"  RPR and STI No results found for: "LABRPR", "RPRTITER"  STI Results GC CT  12/06/2021  7:13 PM Negative  Positive   10/06/2016 12:00 AM **POSITIVE**  Negative     Hepatitis B No results found for: "HEPBSAB", "HEPBSAG", "HEPBCAB" Hepatitis C No results found for: "HEPCAB", "HCVRNAPCRQN" Hepatitis A No results found for: "HAV" Lipids: No results found for: "CHOL", "TRIG", "HDL", "CHOLHDL", "VLDL", "LDLCALC"  TARGET DATE: The 28th of the month  Current PrEP Regimen: Descovy  Assessment: Adam Luna presents to clinic today to discuss PrEP options as his [facility] family medicine provider referred him here. He has been taking Descovy since 2022 and has been tolerating it well. He now has demonstrated uptrending serum creatinine results over the last 6 months (see [facility] labs in Epic), and Dr. Hyman Hopes is concerned this could be due to Descovy. Though I discussed how this could be multifactorial for him, I do think discussing alternative PrEP options with him is appropriate. Reviewed that Descovy is the preferred oral option for him as Truvada's risk for nephrotoxicity is increased comparatively. Reviewed his alternative option of Apretude which he agreed would be best for him.  Adam Luna received medical benefits approval today, so will start his first injections today.   Patient discontinued Descovy per Dr. Hyman Hopes ~2 weeks ago. He denies any recent sexual encounters since his family medicine appointment; his HIV test was negative at this visit. Originally ordered HIV antibody today prior to receiving Apretude approval. Collecting an HIV RNA would have required an additional stick for the patient. As he had a negative HIV antibody in May and has not been sexually active since then and as patient would not be willing to be stuck again for blood, I am ok utilizing his HIV antibody test today. Will continue checking  HIV RNA at follow-up visits.   Of note, Adam Luna is sexually active with women only and participates in insertive vaginal sex and oral sex. He does not use condoms and has been diagnosed with STIs in the past. He [place] when he feels urinary burning developing. Reviewed that we can screen for STI testing during each of his visits to ensure he is negative for gonorrhea as well if he experiences STI symptoms. He also takes suppressive Valtrex for HSV outbreaks per his PCP. He also has tested negative for HBV sAg at [facility] in December 2019 and had a negative HCV sAb in 2020. Will check hepatitis A and B serologies today.   Counseled that Apretude is one intramuscular injection in the gluteal muscle for each visit. Explained that the second injection is 30 days after the initial injection then every 2 months thereafter. Discussed follow up appointments moving forward. It is required to have a negative HIV test immediately prior to injection administration. Screened patient for acute HIV symptoms such as fatigue, muscle aches, rash, sore throat, lymphadenopathy, headache, night sweats, nausea/vomiting/diarrhea, and fever. Patient denies any symptoms.   Explained that showing up to injection appointments is very important and warned that if appointments are missed, protection will be minimal and the risk of acquiring HIV becomes much higher. Counseled on possible side effects associated with the injections such as injection site pain, which is usually mild to moderate in nature, injection site nodules, and injection site reactions. Asked to call the clinic or send me a mychart message if they experience any issues. Advised that they can take Motrin or Tylenol for injection site pain if needed. They may also pre-treat with Motrin or Tylenol about 30-45 minutes before scheduled appointments.    Per [employer] guidelines, a rapid HIV test should be drawn prior to  Apretude administration. Due to state shortage of rapid HIV tests, this is temporarily unable to be done. Per decision from [facility] physicians, we will proceed with Apretude administration at this time without a negative rapid HIV test beforehand. HIV test was collected today and is in process.  Administered cabotegravir 600mg /57mL in right upper outer quadrant of the gluteal muscle. Monitored patient for 10 minutes after injection. Injections were tolerated well without issue. Will make follow up appointments for second initiation injection in 30 days and then maintenance injections every 2 months thereafter.   Plan: - Stop Descovy - First Apretude injection administered - Second initiation injection scheduled for 7/26 with me  - Maintenance injections scheduled for 9/24 with me  - Check HIV antibody, HBV sAb, HBV sAg, and HAV sAb  - Call with any issues or questions  Margarite Gouge, PharmD, CPP, BCIDP, AAHIVP Clinical Pharmacist Practitioner Infectious Diseases Clinical Pharmacist [facility]

## 2023-01-26 LAB — HEPATITIS A ANTIBODY, TOTAL: Hepatitis A AB,Total: NONREACTIVE

## 2023-01-26 LAB — HIV ANTIBODY (ROUTINE TESTING W REFLEX): HIV 1&2 Ab, 4th Generation: NONREACTIVE

## 2023-01-26 LAB — HEPATITIS B SURFACE ANTIBODY,QUALITATIVE: Hep B S Ab: REACTIVE — AB

## 2023-01-26 LAB — HEPATITIS B SURFACE ANTIGEN: Hepatitis B Surface Ag: NONREACTIVE

## 2023-02-19 ENCOUNTER — Telehealth: Payer: Self-pay | Admitting: Pharmacist

## 2023-02-19 NOTE — Telephone Encounter (Signed)
Patient called endorsing itchiness ~80% of his back, particularly around his upper back, which has been ongoing for ~1 month. He denies any rash, myopathies, facial/tongue/lip swelling, difficulty breathing, tiredness, or blisters. The onset of the itchiness aligns with when he started Apretude on 01/25/23. States OTC steroid creams have not helped and that his PCP prescribed in oral hydrocortisone though I do not see this on his dispense history.   He states he did switch detergents around this time, so I encouraged him to stop using this new detergent which he agreed to. I do not think this is a reaction to Apretude particularly given that it is not concentrated around the injection site, but I wanted to reach out for a second opinion and hear your thoughts. Would appreciate any insights.  Thank you!  Margarite Gouge, PharmD, CPP, BCIDP, AAHIVP Clinical Pharmacist Practitioner Infectious Diseases Clinical Pharmacist [facility]

## 2023-02-22 ENCOUNTER — Other Ambulatory Visit: Payer: Self-pay

## 2023-02-22 ENCOUNTER — Ambulatory Visit: Payer: 59 | Admitting: Pharmacist

## 2023-02-22 ENCOUNTER — Ambulatory Visit (INDEPENDENT_AMBULATORY_CARE_PROVIDER_SITE_OTHER): Payer: 59 | Admitting: Pharmacist

## 2023-02-22 DIAGNOSIS — Z2981 Encounter for HIV pre-exposure prophylaxis: Secondary | ICD-10-CM

## 2023-02-22 DIAGNOSIS — Z79899 Other long term (current) drug therapy: Secondary | ICD-10-CM

## 2023-02-22 MED ORDER — CABOTEGRAVIR ER 600 MG/3ML IM SUER
600.0000 mg | Freq: Once | INTRAMUSCULAR | Status: AC
  Administered 2023-02-22: 600 mg via INTRAMUSCULAR

## 2023-02-22 NOTE — Progress Notes (Signed)
HPI: Adam Luna is a 52 y.o. male who presents to the [facility] pharmacy clinic for Apretude administration and HIV PrEP follow up.  There are no problems to display for this patient.   Patient's Medications  New Prescriptions   No medications on file  Previous Medications   AMLODIPINE (NORVASC) 10 MG TABLET    Take 10 mg by mouth daily.   CABOTEGRAVIR ER (APRETUDE) 600 MG/3ML INJECTION    Inject 3 mLs (600 mg total) into the muscle every 2 (two) months.   DULOXETINE (CYMBALTA) 60 MG CAPSULE    Take 60 mg by mouth 2 (two) times daily.   ESZOPICLONE 3 MG TABS    Take 3 mg by mouth at bedtime. Take immediately before bedtime    OMEGA-3 FATTY ACIDS (OMEGA III EPA+DHA) 1000 MG CAPS    Take by mouth.   VALSARTAN-HYDROCHLOROTHIAZIDE (DIOVAN-HCT) 320-25 MG TABLET    Take 1 tablet by mouth daily.  Modified Medications   No medications on file  Discontinued Medications   No medications on file    Allergies: Allergies  Allergen Reactions   Omeprazole Anxiety    Past Medical History: Past Medical History:  Diagnosis Date   ADHD (attention deficit hyperactivity disorder)    Anxiety and depression    Hypertension    OCD (obsessive compulsive disorder)     Social History: Social History   Socioeconomic History   Marital status: Married    Spouse name: Not on file   Number of children: Not on file   Years of education: Not on file   Highest education level: Not on file  Occupational History   Not on file  Tobacco Use   Smoking status: Never   Smokeless tobacco: Never  Substance and Sexual Activity   Alcohol use: No   Drug use: No   Sexual activity: Yes  Other Topics Concern   Not on file  Social History Narrative   Not on file   Social Determinants of Health   Financial Resource Strain: Not on file  Food Insecurity: Not on file  Transportation Needs: Not on file  Physical Activity: Not on file  Stress: Not on file  Social Connections: Not on file    Labs: No  results found for: "HIV1RNAQUANT", "HIV1RNAVL", "CD4TABS"  RPR and STI No results found for: "LABRPR", "RPRTITER"  STI Results GC CT  12/06/2021  7:13 PM Negative  Positive   10/06/2016 12:00 AM **POSITIVE**  Negative     Hepatitis B Lab Results  Component Value Date   HEPBSAB REACTIVE (A) 01/25/2023   HEPBSAG NON-REACTIVE 01/25/2023   Hepatitis C No results found for: "HEPCAB", "HCVRNAPCRQN" Hepatitis A Lab Results  Component Value Date   HAV NON-REACTIVE 01/25/2023   Lipids: No results found for: "CHOL", "TRIG", "HDL", "CHOLHDL", "VLDL", "LDLCALC"  TARGET DATE: The 28th of the month  Current PrEP Regimen: Apretude  Assessment: Adam Luna presents today for their second Apretude injection and to follow up for HIV PrEP. No known exposures to any STIs and no signs or symptoms of any STIs today. Last STI screening was in June and was negative. Screened patient for acute HIV symptoms such as fatigue, muscle aches, rash, sore throat, lymphadenopathy, headache, night sweats, nausea/vomiting/diarrhea, and fever. Patient denies any symptoms. No new partners since last injection; will check HIV RNA today. Politely declines STI testing along with HAV vaccine today.   Patient endorses ongoing back pruritus for the last month which has improved since we spoke on the  phone earlier this week. He states he sweats often especially during the summer and is attributing this to a heat rash, though I do not see any noticeable rash or bumps on his skin. He clarified his PCP did prescribe hydroxyzine which has been helping but makes him tired. He would like to continue Apretude as we both agree this is likely not a reaction to the medication. He does not wish to trial a period of time off from Apretude to see if that helps relieve the pruritus.   Per [employer] guidelines, a rapid HIV test should be drawn prior to Apretude administration. Due to state shortage of rapid HIV tests, this is temporarily  unable to be done. Per decision from [facility] physicians, we will proceed with Apretude administration at this time without a negative rapid HIV test beforehand. HIV RNA was collected today and is in process.  Administered cabotegravir 600mg /83mL in right upper outer quadrant of the gluteal muscle. Will make follow up appointments for maintenance injections every 2 months.   Plan: - Maintenance injections scheduled for 9/24 with me  - Check HIV RNA - Call with any issues or questions  Margarite Gouge, PharmD, CPP, BCIDP, AAHIVP Clinical Pharmacist Practitioner Infectious Diseases Clinical Pharmacist [facility]

## 2023-03-10 ENCOUNTER — Encounter (HOSPITAL_BASED_OUTPATIENT_CLINIC_OR_DEPARTMENT_OTHER): Payer: Self-pay | Admitting: Emergency Medicine

## 2023-03-10 ENCOUNTER — Emergency Department (HOSPITAL_BASED_OUTPATIENT_CLINIC_OR_DEPARTMENT_OTHER)
Admission: EM | Admit: 2023-03-10 | Discharge: 2023-03-11 | Disposition: A | Discharge status: Home / Self Care | Payer: 59 | Source: Home / Self Care | Attending: Emergency Medicine | Admitting: Emergency Medicine

## 2023-03-10 DIAGNOSIS — I129 Hypertensive chronic kidney disease with stage 1 through stage 4 chronic kidney disease, or unspecified chronic kidney disease: Secondary | ICD-10-CM | POA: Insufficient documentation

## 2023-03-10 DIAGNOSIS — Z79899 Other long term (current) drug therapy: Secondary | ICD-10-CM | POA: Diagnosis not present

## 2023-03-10 DIAGNOSIS — I10 Essential (primary) hypertension: Secondary | ICD-10-CM

## 2023-03-10 DIAGNOSIS — N189 Chronic kidney disease, unspecified: Secondary | ICD-10-CM | POA: Insufficient documentation

## 2023-03-10 LAB — COMPREHENSIVE METABOLIC PANEL
ALT: 32 U/L (ref 0–44)
AST: 28 U/L (ref 15–41)
Albumin: 4 g/dL (ref 3.5–5.0)
Alkaline Phosphatase: 65 U/L (ref 38–126)
Anion gap: 8 (ref 5–15)
BUN: 26 mg/dL — ABNORMAL HIGH (ref 6–20)
CO2: 28 mmol/L (ref 22–32)
Calcium: 9.2 mg/dL (ref 8.9–10.3)
Chloride: 102 mmol/L (ref 98–111)
Creatinine, Ser: 1.67 mg/dL — ABNORMAL HIGH (ref 0.61–1.24)
GFR, Estimated: 49 mL/min — ABNORMAL LOW (ref >60)
Glucose, Bld: 97 mg/dL (ref 70–99)
Potassium: 3.6 mmol/L (ref 3.5–5.1)
Sodium: 138 mmol/L (ref 135–145)
Total Bilirubin: 0.7 mg/dL (ref 0.3–1.2)
Total Protein: 7.3 g/dL (ref 6.5–8.1)

## 2023-03-10 LAB — CBC WITH DIFFERENTIAL/PLATELET
Abs Immature Granulocytes: 0.02 10*3/uL (ref 0.00–0.07)
Basophils Absolute: 0 10*3/uL (ref 0.0–0.1)
Basophils Relative: 1 %
Eosinophils Absolute: 0.2 10*3/uL (ref 0.0–0.5)
Eosinophils Relative: 4 %
HCT: 43.8 % (ref 39.0–52.0)
Hemoglobin: 14.7 g/dL (ref 13.0–17.0)
Immature Granulocytes: 1 %
Lymphocytes Relative: 44 %
Lymphs Abs: 1.9 10*3/uL (ref 0.7–4.0)
MCH: 29.3 pg (ref 26.0–34.0)
MCHC: 33.6 g/dL (ref 30.0–36.0)
MCV: 87.3 fL (ref 80.0–100.0)
Monocytes Absolute: 0.4 10*3/uL (ref 0.1–1.0)
Monocytes Relative: 10 %
Neutro Abs: 1.7 10*3/uL (ref 1.7–7.7)
Neutrophils Relative %: 40 %
Platelets: 188 10*3/uL (ref 150–400)
RBC: 5.02 MIL/uL (ref 4.22–5.81)
RDW: 13.4 % (ref 11.5–15.5)
WBC: 4.2 10*3/uL (ref 4.0–10.5)
nRBC: 0 % (ref 0.0–0.2)

## 2023-03-10 LAB — URINALYSIS, ROUTINE W REFLEX MICROSCOPIC
Bilirubin Urine: NEGATIVE
Glucose, UA: NEGATIVE mg/dL
Hgb urine dipstick: NEGATIVE
Ketones, ur: NEGATIVE mg/dL
Leukocytes,Ua: NEGATIVE
Nitrite: NEGATIVE
Protein, ur: NEGATIVE mg/dL
Specific Gravity, Urine: >=1.03 (ref 1.005–1.030)
pH: 5.5 (ref 5.0–8.0)

## 2023-03-10 NOTE — ED Triage Notes (Signed)
Pt c/o generally not feeling well; sts BP uncontrolled x 2 mos despite compliance with medication; recent labs at PCP indicated kidney damage per pt; reports intermittent numbness to lip, head, LUE and feet x 2 mos

## 2023-03-11 NOTE — ED Provider Notes (Signed)
[place] [facility]  Provider Note  CSN: [id number] Arrival date & time: 03/10/23 1746  History Chief Complaint  Patient presents with   Hypertension    Kadar Merkerson is a 52 y.o. male with history of anxiety and HTN reports his BP has been elevated for the last several months. Had some blood work recently with worsening of his CKD (1.6 -> 2.1)that has been worrying him. He reports he has been itching for several months also. He has not been having numbness as reported in Triage that is the itching that he has and is concerned it is related to his kidneys 'shutting down'. Otherwise he has been asymptomatic. He has been compliant with medications.    Home Medications Prior to Admission medications   Medication Sig Start Date End Date Taking? Authorizing Provider  amLODipine (NORVASC) 10 MG tablet Take 10 mg by mouth daily.    [provider]  cabotegravir ER (APRETUDE) 600 MG/3ML injection Inject 3 mLs (600 mg total) into the muscle every 2 (two) months. 01/25/23   Jennette Kettle, RPH-CPP  DULoxetine (CYMBALTA) 60 MG capsule Take 60 mg by mouth 2 (two) times daily.    [provider]  Eszopiclone 3 MG TABS Take 3 mg by mouth at bedtime. Take immediately before bedtime     [provider]  Omega-3 Fatty Acids (OMEGA III EPA+DHA) 1000 MG CAPS Take by mouth. 08/26/18   [provider]  valsartan-hydrochlorothiazide (DIOVAN-HCT) 320-25 MG tablet Take 1 tablet by mouth daily.    [provider]     Allergies    Omeprazole   Review of Systems   Review of Systems Please see HPI for pertinent positives and negatives  Physical Exam BP (!) 160/105   Pulse 63   Temp (!) 97.2 F (36.2 C) (Temporal)   Resp 16   Ht 6\' 2"  (1.88 m)   Wt 122.5 kg   SpO2 96%   BMI 34.67 kg/m   Physical Exam Vitals and nursing note reviewed.  Constitutional:      Appearance: Normal appearance.  HENT:     Head:  Normocephalic and atraumatic.     Nose: Nose normal.     Mouth/Throat:     Mouth: Mucous membranes are moist.  Eyes:     Extraocular Movements: Extraocular movements intact.     Conjunctiva/sclera: Conjunctivae normal.  Cardiovascular:     Rate and Rhythm: Normal rate.  Pulmonary:     Effort: Pulmonary effort is normal.     Breath sounds: Normal breath sounds.  Abdominal:     General: Abdomen is flat.     Palpations: Abdomen is soft.     Tenderness: There is no abdominal tenderness.  Musculoskeletal:        General: No swelling. Normal range of motion.     Cervical back: Neck supple.  Skin:    General: Skin is warm and dry.     Findings: No rash.  Neurological:     General: No focal deficit present.     Mental Status: He is alert.  Psychiatric:        Mood and Affect: Mood normal.     ED Results / Procedures / Treatments   EKG None  Procedures Procedures  Medications Ordered in the ED Medications - No data to display  Initial Impression and Plan  Patient well appearing, here with concerns about elevated BP and recently labs with worsening CKD. He had labs done  in triage showing normal CBC, BMP with Cr back to prior baseline. UA is normal. Patient reassured his kidneys are not shutting down and this is not related to his itching. He was encouraged to follow up with his PCP for long term management. No signs of end organ damage today.   ED Course       MDM Rules/Calculators/A&P Medical Decision Making Problems Addressed: Chronic kidney disease, unspecified CKD stage: chronic illness or injury Hypertension, unspecified type: chronic illness or injury  Amount and/or Complexity of Data Reviewed Labs: ordered. Decision-making details documented in ED Course.  Risk Prescription drug management.     Final Clinical Impression(s) / ED Diagnoses Final diagnoses:  Hypertension, unspecified type  Chronic kidney disease, unspecified CKD stage    Rx / DC  Orders ED Discharge Orders     None        Pollyann Savoy, MD 03/11/23 [id number]

## 2023-04-23 ENCOUNTER — Ambulatory Visit: Payer: 59 | Admitting: Pharmacist

## 2023-04-23 NOTE — Progress Notes (Unsigned)
HPI: Adam Luna is a 52 y.o. male who presents to the [facility] pharmacy clinic for Apretude administration and HIV PrEP follow up.  There are no problems to display for this patient.   Patient's Medications  New Prescriptions   No medications on file  Previous Medications   AMLODIPINE (NORVASC) 10 MG TABLET    Take 10 mg by mouth daily.   CABOTEGRAVIR ER (APRETUDE) 600 MG/3ML INJECTION    Inject 3 mLs (600 mg total) into the muscle every 2 (two) months.   DULOXETINE (CYMBALTA) 60 MG CAPSULE    Take 60 mg by mouth 2 (two) times daily.   ESZOPICLONE 3 MG TABS    Take 3 mg by mouth at bedtime. Take immediately before bedtime    OMEGA-3 FATTY ACIDS (OMEGA III EPA+DHA) 1000 MG CAPS    Take by mouth.   VALSARTAN-HYDROCHLOROTHIAZIDE (DIOVAN-HCT) 320-25 MG TABLET    Take 1 tablet by mouth daily.  Modified Medications   No medications on file  Discontinued Medications   No medications on file    Allergies: Allergies  Allergen Reactions   Omeprazole Anxiety    Past Medical History: Past Medical History:  Diagnosis Date   ADHD (attention deficit hyperactivity disorder)    Anxiety and depression    Hypertension    OCD (obsessive compulsive disorder)     Social History: Social History   Socioeconomic History   Marital status: Married    Spouse name: Not on file   Number of children: Not on file   Years of education: Not on file   Highest education level: Not on file  Occupational History   Not on file  Tobacco Use   Smoking status: Never   Smokeless tobacco: Never  Substance and Sexual Activity   Alcohol use: No   Drug use: No   Sexual activity: Yes  Other Topics Concern   Not on file  Social History Narrative   Not on file   Social Determinants of Health   Financial Resource Strain: Not on file  Food Insecurity: Not on file  Transportation Needs: Not on file  Physical Activity: Not on file  Stress: Not on file  Social Connections: Not on file    Labs: Lab  Results  Component Value Date   HIV1RNAQUANT Not Detected 02/22/2023    RPR and STI No results found for: "LABRPR", "RPRTITER"  STI Results GC CT  12/06/2021  7:13 PM Negative  Positive   10/06/2016 12:00 AM **POSITIVE**  Negative     Hepatitis B Lab Results  Component Value Date   HEPBSAB REACTIVE (A) 01/25/2023   HEPBSAG NON-REACTIVE 01/25/2023   Hepatitis C No results found for: "HEPCAB", "HCVRNAPCRQN" Hepatitis A Lab Results  Component Value Date   HAV NON-REACTIVE 01/25/2023   Lipids: No results found for: "CHOL", "TRIG", "HDL", "CHOLHDL", "VLDL", "LDLCALC"  TARGET DATE: The 28th of the month  Assessment: Adam Luna presents today for their Apretude injection and to follow up for HIV PrEP. No issues with past injections. Adam Luna previously reported itchiness on his back after his initial injections. States he discussed this symptoms in conjunction with tingles/numbness in his legs with another provider who diagnosed him with neuropathy and did not associate these symptoms with Apretude. Screened patient for acute HIV symptoms such as fatigue, muscle aches, rash, sore throat, lymphadenopathy, headache, night sweats, nausea/vomiting/diarrhea, and fever. Patient denies any symptoms.   Per [employer] guidelines, a rapid HIV test should be drawn prior to Apretude administration. Due  to state shortage of rapid HIV tests, this is temporarily unable to be done. Per decision from [facility] physicians, we will proceed with Apretude administration at this time without a negative rapid HIV test beforehand. HIV RNA was collected today and is in process.  Administered cabotegravir 600mg /42mL in right upper outer quadrant of the gluteal muscle. Will make follow up appointments for maintenance injections every 2 months.   No known exposures to any STIs and no signs or symptoms of any STIs today.  No new partners since last injection; politely declines STI testing today.   Due for HAV, flu, and  COVID which he politely declines today.   Plan: - Administer Apretude 600 mg x 1  - Maintenance injections scheduled for 11/22 and 1/21 with me  - Check HIV RNA - Call with any issues or questions  Margarite Gouge, PharmD, CPP, BCIDP, AAHIVP Clinical Pharmacist Practitioner Infectious Diseases Clinical Pharmacist [facility]

## 2023-04-24 ENCOUNTER — Other Ambulatory Visit: Payer: Self-pay

## 2023-04-24 ENCOUNTER — Ambulatory Visit (INDEPENDENT_AMBULATORY_CARE_PROVIDER_SITE_OTHER): Payer: 59 | Admitting: Pharmacist

## 2023-04-24 DIAGNOSIS — Z2981 Encounter for HIV pre-exposure prophylaxis: Secondary | ICD-10-CM

## 2023-04-24 DIAGNOSIS — Z79899 Other long term (current) drug therapy: Secondary | ICD-10-CM

## 2023-04-24 MED ORDER — CABOTEGRAVIR ER 600 MG/3ML IM SUER
600.0000 mg | Freq: Once | INTRAMUSCULAR | Status: AC
Start: 2023-04-24 — End: 2023-04-24
  Administered 2023-04-24: 600 mg via INTRAMUSCULAR

## 2023-04-27 LAB — HIV-1 RNA QUANT-NO REFLEX-BLD
HIV 1 RNA Quant: NOT DETECTED {copies}/mL
HIV-1 RNA Quant, Log: NOT DETECTED {Log}

## 2023-06-14 ENCOUNTER — Telehealth: Payer: Self-pay

## 2023-06-14 NOTE — Telephone Encounter (Signed)
error 

## 2023-06-20 NOTE — Progress Notes (Signed)
HPI: Adam Luna is a 52 y.o. male who presents to the [facility] pharmacy clinic for Apretude administration and HIV PrEP follow up.  Insured   [x]    Uninsured  []    There are no problems to display for this patient.   Patient's Medications  New Prescriptions   No medications on file  Previous Medications   AMLODIPINE (NORVASC) 10 MG TABLET    Take 10 mg by mouth daily.   CABOTEGRAVIR ER (APRETUDE) 600 MG/3ML INJECTION    Inject 3 mLs (600 mg total) into the muscle every 2 (two) months.   DULOXETINE (CYMBALTA) 60 MG CAPSULE    Take 60 mg by mouth 2 (two) times daily.   ESZOPICLONE 3 MG TABS    Take 3 mg by mouth at bedtime. Take immediately before bedtime    OMEGA-3 FATTY ACIDS (OMEGA III EPA+DHA) 1000 MG CAPS    Take by mouth.   VALSARTAN-HYDROCHLOROTHIAZIDE (DIOVAN-HCT) 320-25 MG TABLET    Take 1 tablet by mouth daily.  Modified Medications   No medications on file  Discontinued Medications   No medications on file    Allergies: Allergies  Allergen Reactions   Omeprazole Anxiety    Labs: Lab Results  Component Value Date   HIV1RNAQUANT Not Detected 04/24/2023   HIV1RNAQUANT Not Detected 02/22/2023    RPR and STI No results found for: "LABRPR", "RPRTITER"  STI Results GC CT  12/06/2021  7:13 PM Negative  Positive   10/06/2016 12:00 AM **POSITIVE**  Negative     Hepatitis B Lab Results  Component Value Date   HEPBSAB REACTIVE (A) 01/25/2023   HEPBSAG NON-REACTIVE 01/25/2023   Hepatitis C No results found for: "HEPCAB", "HCVRNAPCRQN" Hepatitis A Lab Results  Component Value Date   HAV NON-REACTIVE 01/25/2023   Lipids: No results found for: "CHOL", "TRIG", "HDL", "CHOLHDL", "VLDL", "LDLCALC"  TARGET DATE: The 28th of the month  Assessment: Karlan presents today for his Apretude injection and to follow up for HIV PrEP. No issues with past injections. Denies any symptoms of acute HIV. No known exposures to any STIs since last visit. Ferrel declines STI  testing today. Discussed his previous rash reaction. He reports that this has be identified as being related more to a bulging disc in his back that is causing nerve pain. He will be following up on management of this.  Per [employer] guidelines, a rapid HIV test should be drawn prior to Apretude administration. Due to state shortage of rapid HIV tests, this is temporarily unable to be done. Per decision from [facility] physicians, we will proceed with Apretude administration at this time without a negative rapid HIV test beforehand. HIV RNA was collected today and is in process.  Administered cabotegravir 600mg /67mL in right upper outer quadrant of the gluteal muscle. Will see Daril back in 2 months for injection, labs, and HIV PrEP follow up.  Due for HAV, influenza, and COVID vaccines. He would like to get his influenza vaccine today. He requested information regarding PCV20. Reviewed CDC recommendations, which were recently revised to include all adults age 39 or greater. Given that this recommendation is less than 75 month old, we will hold off for now pending confirmation that insurance will cover.  Plan: - Apretude injection administered - HIV RNA today - Administered flu shot  - Next injection, labs, and PrEP follow up appointment scheduled for 08/20/23 with Marchelle Folks - Call with any issues or questions  Lora Paula, PharmD PGY-2 Infectious Diseases Pharmacy Resident [facility]  for Infectious Disease

## 2023-06-21 ENCOUNTER — Other Ambulatory Visit: Payer: Self-pay

## 2023-06-21 ENCOUNTER — Ambulatory Visit (INDEPENDENT_AMBULATORY_CARE_PROVIDER_SITE_OTHER): Payer: 59 | Admitting: Pharmacist

## 2023-06-21 DIAGNOSIS — Z2981 Encounter for HIV pre-exposure prophylaxis: Secondary | ICD-10-CM

## 2023-06-21 DIAGNOSIS — Z23 Encounter for immunization: Secondary | ICD-10-CM | POA: Diagnosis not present

## 2023-06-21 MED ORDER — CABOTEGRAVIR ER 600 MG/3ML IM SUER
600.0000 mg | Freq: Once | INTRAMUSCULAR | Status: AC
Start: 2023-06-21 — End: 2023-06-21
  Administered 2023-06-21: 600 mg via INTRAMUSCULAR

## 2023-06-24 LAB — HIV-1 RNA QUANT-NO REFLEX-BLD
HIV 1 RNA Quant: NOT DETECTED {copies}/mL
HIV-1 RNA Quant, Log: NOT DETECTED {Log}

## 2023-08-19 NOTE — Progress Notes (Unsigned)
   HPI: Adam Luna is a 53 y.o. male who presents to the [facility] pharmacy clinic for Apretude administration and HIV PrEP follow up.  Insured   [x]    Uninsured  []    There are no active problems to display for this patient.   Patient's Medications  New Prescriptions   No medications on file  Previous Medications   AMLODIPINE (NORVASC) 10 MG TABLET    Take 10 mg by mouth daily.   CABOTEGRAVIR ER (APRETUDE) 600 MG/3ML INJECTION    Inject 3 mLs (600 mg total) into the muscle every 2 (two) months.   DULOXETINE (CYMBALTA) 60 MG CAPSULE    Take 60 mg by mouth 2 (two) times daily.   ESZOPICLONE 3 MG TABS    Take 3 mg by mouth at bedtime. Take immediately before bedtime    OMEGA-3 FATTY ACIDS (OMEGA III EPA+DHA) 1000 MG CAPS    Take by mouth.   VALSARTAN-HYDROCHLOROTHIAZIDE (DIOVAN-HCT) 320-25 MG TABLET    Take 1 tablet by mouth daily.  Modified Medications   No medications on file  Discontinued Medications   No medications on file    Allergies: Allergies  Allergen Reactions   Omeprazole Anxiety    Labs: Lab Results  Component Value Date   HIV1RNAQUANT Not Detected 06/21/2023   HIV1RNAQUANT Not Detected 04/24/2023   HIV1RNAQUANT Not Detected 02/22/2023    RPR and STI No results found for: "LABRPR", "RPRTITER"  STI Results GC CT  12/06/2021  7:13 PM Negative  Positive   10/06/2016 12:00 AM **POSITIVE**  Negative     Hepatitis B Lab Results  Component Value Date   HEPBSAB REACTIVE (A) 01/25/2023   HEPBSAG NON-REACTIVE 01/25/2023   Hepatitis C No results found for: "HEPCAB", "HCVRNAPCRQN" Hepatitis A Lab Results  Component Value Date   HAV NON-REACTIVE 01/25/2023   Lipids: No results found for: "CHOL", "TRIG", "HDL", "CHOLHDL", "VLDL", "LDLCALC"  TARGET DATE: 28th day of the month  Assessment: Adam Luna presents today for thier Apretude injection and to follow up for HIV PrEP. No issues with past injections. Denies any symptoms of acute HIV. Last STI screening was  on 11/2021 with RPR and urine cytology and was positive for genital chlamydia treated appropriately with doxycycline 100 mg BID x7days. No known exposures to any STIs since last visit. They agree to RPR today to test for syphilis.   Vaccination: HepA Ab from 12/2022 non-reactive. Due for HepA 1/2 vaccine today. Also due for COVID-19 vaccine today. Patient inquired about PCV20 vaccine today. [facility] pharmacy unsure if patient will be covered by insurance for PCV20. Will defer PCV20 to patient's PCP.   Per [employer] guidelines, a rapid HIV test should be drawn prior to Apretude administration. Due to state shortage of rapid HIV tests, this is temporarily unable to be done. Per decision from [facility] physicians, we will proceed with Apretude administration at this time without a negative rapid HIV test beforehand. HIV RNA was collected today and is in process.  Administered cabotegravir 600mg /74mL in left upper outer quadrant of the gluteal muscle. Will see them back in 2 months for injection, labs, and HIV PrEP follow up.  Plan: - Apretude injection administered - HIV RNA today - HepA 1/2 and COVID-19 vaccines today - Next injection, labs, and PrEP follow up appointment scheduled for 10/18/23 with Marchelle Folks - HepA 2/2 due in 01/2024 - Call with any issues or questions  Dimple Casey. Edwena Blow, PharmD Candidate [facility] [employer]

## 2023-08-20 ENCOUNTER — Ambulatory Visit (INDEPENDENT_AMBULATORY_CARE_PROVIDER_SITE_OTHER): Payer: 59 | Admitting: Pharmacist

## 2023-08-20 ENCOUNTER — Other Ambulatory Visit: Payer: Self-pay

## 2023-08-20 ENCOUNTER — Other Ambulatory Visit (HOSPITAL_COMMUNITY): Payer: Self-pay

## 2023-08-20 DIAGNOSIS — Z2981 Encounter for HIV pre-exposure prophylaxis: Secondary | ICD-10-CM | POA: Diagnosis not present

## 2023-08-20 DIAGNOSIS — Z23 Encounter for immunization: Secondary | ICD-10-CM | POA: Diagnosis not present

## 2023-08-20 MED ORDER — CABOTEGRAVIR ER 600 MG/3ML IM SUER
600.0000 mg | Freq: Once | INTRAMUSCULAR | Status: AC
Start: 2023-08-20 — End: 2023-08-20
  Administered 2023-08-20: 600 mg via INTRAMUSCULAR

## 2023-08-22 LAB — HIV-1 RNA QUANT-NO REFLEX-BLD
HIV 1 RNA Quant: NOT DETECTED {copies}/mL
HIV-1 RNA Quant, Log: NOT DETECTED {Log}

## 2023-08-22 LAB — RPR: RPR Ser Ql: NONREACTIVE

## 2023-09-11 ENCOUNTER — Other Ambulatory Visit: Payer: Self-pay

## 2023-09-11 ENCOUNTER — Other Ambulatory Visit: Payer: Self-pay | Admitting: Pharmacist

## 2023-09-11 ENCOUNTER — Other Ambulatory Visit (HOSPITAL_COMMUNITY): Payer: Self-pay

## 2023-09-11 DIAGNOSIS — Z2981 Encounter for HIV pre-exposure prophylaxis: Secondary | ICD-10-CM

## 2023-09-11 MED ORDER — APRETUDE 600 MG/3ML IM SUER
600.0000 mg | INTRAMUSCULAR | 5 refills | Status: DC
Start: 2023-09-11 — End: 2023-10-23
  Filled 2023-09-11: qty 3, 60d supply, fill #0

## 2023-09-11 NOTE — Progress Notes (Signed)
Specialty Pharmacy Initial Fill Coordination Note  Adam Luna is a 53 y.o. male contacted today regarding initial fill of specialty medication(s) Cabotegravir (Apretude)   Patient requested Courier to Provider Office   Delivery date: 10/07/23   Verified address: [address] [place] [address]   Medication will be filled on 10/04/23.   Patient is aware of 0.00 copayment.

## 2023-10-03 ENCOUNTER — Other Ambulatory Visit: Payer: Self-pay

## 2023-10-04 ENCOUNTER — Other Ambulatory Visit: Payer: Self-pay

## 2023-10-07 ENCOUNTER — Telehealth: Payer: Self-pay

## 2023-10-07 NOTE — Telephone Encounter (Signed)
 [facility] Patient Advocate Encounter  Patient's medications Apretude have been couriered to [facility] from [facility] and will be administered at the patients appointment on 10/23/23.  Clearance Coots, CPhT Specialty Pharmacy Patient [facility] for Infectious Disease Phone: [phone] Fax:  [phone]

## 2023-10-18 ENCOUNTER — Ambulatory Visit: Payer: 59 | Admitting: Pharmacist

## 2023-10-18 NOTE — Progress Notes (Signed)
 Adam Luna is a 53 y.o. male who presents to the [facility] pharmacy clinic for HIV PrEP follow up.  There are no active problems to display for this patient.   Patient's Medications  New Prescriptions   No medications on file  Previous Medications   AMLODIPINE (NORVASC) 10 MG TABLET    Take 10 mg by mouth daily.   CABOTEGRAVIR ER (APRETUDE) 600 MG/3ML INJECTION    Inject 3 mLs (600 mg total) into the muscle every 2 (two) months.   CABOTEGRAVIR ER (APRETUDE) 600 MG/3ML INJECTION    Inject 3 mLs (600 mg total) into the muscle every 2 (two) months.   DULOXETINE (CYMBALTA) 60 MG CAPSULE    Take 60 mg by mouth 2 (two) times daily.   ESZOPICLONE 3 MG TABS    Take 3 mg by mouth at bedtime. Take immediately before bedtime    OMEGA-3 FATTY ACIDS (OMEGA III EPA+DHA) 1000 MG CAPS    Take by mouth.   VALSARTAN-HYDROCHLOROTHIAZIDE (DIOVAN-HCT) 320-25 MG TABLET    Take 1 tablet by mouth daily.  Modified Medications   No medications on file  Discontinued Medications   No medications on file    Allergies: Allergies  Allergen Reactions   Omeprazole Anxiety    Past Medical History: Past Medical History:  Diagnosis Date   ADHD (attention deficit hyperactivity disorder)    Anxiety and depression    Hypertension    OCD (obsessive compulsive disorder)     Social History: Social History   Socioeconomic History   Marital status: Married    Spouse name: Not on file   Number of children: Not on file   Years of education: Not on file   Highest education level: Not on file  Occupational History   Not on file  Tobacco Use   Smoking status: Never   Smokeless tobacco: Never  Substance and Sexual Activity   Alcohol use: No   Drug use: No   Sexual activity: Yes  Other Topics Concern   Not on file  Social History Narrative   Not on file   Social Drivers of Health   Financial Resource Strain: Not on file  Food Insecurity: Not on file  Transportation Needs: Not on file  Physical  Activity: Not on file  Stress: Not on file  Social Connections: Not on file    Labs: Lab Results  Component Value Date   HIV1RNAQUANT Not Detected 08/20/2023   HIV1RNAQUANT Not Detected 06/21/2023   HIV1RNAQUANT Not Detected 04/24/2023    RPR and STI Lab Results  Component Value Date   LABRPR NON-REACTIVE 08/20/2023    STI Results GC CT  12/06/2021  7:13 PM Negative  Positive   10/06/2016 12:00 AM **POSITIVE**  Negative     Hepatitis B Lab Results  Component Value Date   HEPBSAB REACTIVE (A) 01/25/2023   HEPBSAG NON-REACTIVE 01/25/2023   Hepatitis C No results found for: "HEPCAB", "HCVRNAPCRQN" Hepatitis A Lab Results  Component Value Date   HAV NON-REACTIVE 01/25/2023   Lipids: No results found for: "CHOL", "TRIG", "HDL", "CHOLHDL", "VLDL", "LDLCALC"  TARGET DATE: The 28th of the month  Assessment: Ernest presents today for their Apretude injection and to follow up for HIV PrEP. States his neurologist believes Apretude may be contributing to his bilateral lower extremity dysesthesia, and he would like Ca[facility]der to discontinue the injections at this time to investigate improvement. Patient states the sensation (mainly burning with some tingling and numbness) is exacerbated the most the week  following his Apretude injections and then begins to wane while still remaining present. Will transition back to Descovy and see if this helps improve his symptoms; he will continue to follow with neurology about other possible causes. Descovy is covered at no charge through insurance; will send to [facility] on [address] once patient's HIV test returns negative. Will still assess HIV RNA today as he is the tail end period of Apretude dosing and was recently active with new partners.   PCP recently assessed kidney function and lipid panel on 10/10/2023 (Scr 1.7, TC 232, TG 94, HDL 53, LDL 159). Patient's estimated CrCl remains ~90 ml/min, so no concerns with restarting Descovy at this  time. Will monitor Scr every 6 months.   Counseled patient that Descovy is a one pill once daily regimen with or without food that can prevent HIV. Discussed the importance of taking the medication daily to provide protection and decreased adherence is associated with decreased efficacy. Also discussed how Descovy works to prevent HIV but not other STDs and encouraged the use of condoms. Counseled on what to do if dose is missed, if closer to missed dose take immediately, if closer to next dose then skip and resume normal schedule.Counseled patient that Descovy is normally well tolerated, however some patients experience a "start up syndrome" with nausea, diarrhea, dizziness, and fatigue but that those should resolve soon after starting.  Advised that any nausea can be mitigated by taking it with food. I reviewed patient medications and found no drug interactions. Reviewed that he will follow with clinic every 3 months now instead of every 2 months.   Screened patient for acute HIV symptoms such as fatigue, muscle aches, rash, sore throat, lymphadenopathy, headache, night sweats, nausea/vomiting/diarrhea, and fever. Patient denies any symptoms. No known exposures to any STIs and no signs or symptoms of any STIs today. Last STI screening was in January and was negative. Has been active with new partners since last injection; will check urine/oral cytologies today.   Due for 2/2 HAV vaccine in July. Otherwise, up to date on vaccines at this time.   Plan: - Check HIV RNA and urine/oral cytologies - Refill Descovy x 3 months if HIV testing results negative  - Follow up with me on 7/8  - Call with any issues or questions  Margarite Gouge, PharmD, CPP, BCIDP, AAHIVP Clinical Pharmacist Practitioner Infectious Diseases Clinical Pharmacist [facility]

## 2023-10-23 ENCOUNTER — Ambulatory Visit (INDEPENDENT_AMBULATORY_CARE_PROVIDER_SITE_OTHER): Admitting: Pharmacist

## 2023-10-23 ENCOUNTER — Other Ambulatory Visit: Payer: Self-pay

## 2023-10-23 ENCOUNTER — Other Ambulatory Visit (HOSPITAL_COMMUNITY): Payer: Self-pay

## 2023-10-23 ENCOUNTER — Other Ambulatory Visit (HOSPITAL_COMMUNITY)
Admission: RE | Admit: 2023-10-23 | Discharge: 2023-10-23 | Disposition: A | Discharge status: Home / Self Care | Source: Ambulatory Visit | Attending: Infectious Disease | Admitting: Infectious Disease

## 2023-10-23 DIAGNOSIS — Z2981 Encounter for HIV pre-exposure prophylaxis: Secondary | ICD-10-CM | POA: Diagnosis not present

## 2023-10-23 DIAGNOSIS — Z113 Encounter for screening for infections with a predominantly sexual mode of transmission: Secondary | ICD-10-CM | POA: Diagnosis present

## 2023-10-23 LAB — URINE CYTOLOGY ANCILLARY ONLY
Chlamydia: POSITIVE — AB
Comment: NEGATIVE
Comment: NORMAL
Neisseria Gonorrhea: NEGATIVE

## 2023-10-23 LAB — CYTOLOGY, (ORAL, ANAL, URETHRAL) ANCILLARY ONLY
Chlamydia: NEGATIVE
Comment: NEGATIVE
Comment: NORMAL
Neisseria Gonorrhea: NEGATIVE

## 2023-10-24 ENCOUNTER — Other Ambulatory Visit: Payer: Self-pay | Admitting: Pharmacist

## 2023-10-24 ENCOUNTER — Encounter: Payer: Self-pay | Admitting: Pharmacist

## 2023-10-24 DIAGNOSIS — A749 Chlamydial infection, unspecified: Secondary | ICD-10-CM

## 2023-10-24 MED ORDER — DOXYCYCLINE HYCLATE 100 MG PO TABS: 100.0000 mg | ORAL_TABLET | Freq: Two times a day (BID) | ORAL | 0 refills | Status: DC

## 2023-10-25 LAB — HIV-1 RNA QUANT-NO REFLEX-BLD
HIV 1 RNA Quant: NOT DETECTED {copies}/mL
HIV-1 RNA Quant, Log: NOT DETECTED {Log_copies}/mL

## 2023-11-06 ENCOUNTER — Other Ambulatory Visit: Payer: Self-pay | Admitting: Pharmacist

## 2023-11-06 DIAGNOSIS — Z2981 Encounter for HIV pre-exposure prophylaxis: Secondary | ICD-10-CM

## 2023-11-06 DIAGNOSIS — A749 Chlamydial infection, unspecified: Secondary | ICD-10-CM

## 2023-11-06 MED ORDER — DESCOVY 200-25 MG PO TABS
1.0000 | ORAL_TABLET | Freq: Every day | ORAL | 2 refills | Status: DC
Start: 2023-11-06 — End: 2024-02-05

## 2023-11-06 MED ORDER — DOXYCYCLINE HYCLATE 100 MG PO TABS
100.0000 mg | ORAL_TABLET | Freq: Two times a day (BID) | ORAL | 0 refills | Status: AC
Start: 2023-11-06 — End: 2023-11-13

## 2023-11-06 NOTE — Addendum Note (Signed)
 Addended by: Jennette Kettle on: 11/06/2023 01:54 PM   Modules accepted: Orders

## 2023-11-14 ENCOUNTER — Emergency Department (HOSPITAL_BASED_OUTPATIENT_CLINIC_OR_DEPARTMENT_OTHER)
Admission: EM | Admit: 2023-11-14 | Discharge: 2023-11-14 | Disposition: A | Discharge status: Home / Self Care | Attending: Emergency Medicine | Admitting: Emergency Medicine

## 2023-11-14 ENCOUNTER — Other Ambulatory Visit: Payer: Self-pay

## 2023-11-14 ENCOUNTER — Encounter (HOSPITAL_BASED_OUTPATIENT_CLINIC_OR_DEPARTMENT_OTHER): Payer: Self-pay

## 2023-11-14 DIAGNOSIS — N189 Chronic kidney disease, unspecified: Secondary | ICD-10-CM | POA: Diagnosis not present

## 2023-11-14 DIAGNOSIS — R202 Paresthesia of skin: Secondary | ICD-10-CM | POA: Insufficient documentation

## 2023-11-14 DIAGNOSIS — R52 Pain, unspecified: Secondary | ICD-10-CM

## 2023-11-14 DIAGNOSIS — M545 Low back pain, unspecified: Secondary | ICD-10-CM | POA: Diagnosis present

## 2023-11-14 DIAGNOSIS — I129 Hypertensive chronic kidney disease with stage 1 through stage 4 chronic kidney disease, or unspecified chronic kidney disease: Secondary | ICD-10-CM | POA: Diagnosis not present

## 2023-11-14 DIAGNOSIS — M791 Myalgia, unspecified site: Secondary | ICD-10-CM | POA: Insufficient documentation

## 2023-11-14 DIAGNOSIS — Z79899 Other long term (current) drug therapy: Secondary | ICD-10-CM | POA: Diagnosis not present

## 2023-11-14 MED ORDER — MORPHINE SULFATE (PF) 4 MG/ML IV SOLN
4.0000 mg | Freq: Once | INTRAVENOUS | Status: DC
  Filled 2023-11-14: qty 1

## 2023-11-14 MED ORDER — OXYCODONE HCL 5 MG PO TABS
5.0000 mg | ORAL_TABLET | Freq: Once | ORAL | Status: AC
  Administered 2023-11-14: 5 mg via ORAL
  Filled 2023-11-14: qty 1

## 2023-11-14 NOTE — ED Provider Notes (Signed)
 [place] EMERGENCY DEPARTMENT AT [facility] Provider Note   CSN: [id number] Arrival date & time: 11/14/23  7829     History  Chief Complaint  Patient presents with   Back Pain     Grosser is a 53 y.o. male.  The history is provided by the patient.  Back Pain Achilles Neville is a 53 y.o. male who presents to the Emergency Department complaining of pain.  He presents to the emergency department for evaluation of a burning pain in his right lower leg from the knee to the foot described as a burning pins-and-needles.  He also has pain that migrates from location to location throughout his entire back that has been going on for a year.  He has had extensive outpatient workup including MRI, EMG.  He has been seen by PCP, neurology and neurosurgery.  No associated fever, abdominal pain, nausea, vomiting.  No change in his symptoms with walking and has no difficulty walking or weakness.  He has tried pain injections, steroid injections, narcotics, gabapentin without relief.  He presents today to the emergency department due to persistent pain.   Hx/o HTN, CKD.  Takes Descovy for PrEP.    Home Medications Prior to Admission medications   Medication Sig Start Date End Date Taking? Authorizing Provider  amLODipine (NORVASC) 10 MG tablet Take 10 mg by mouth daily.    [provider]  DULoxetine (CYMBALTA) 60 MG capsule Take 60 mg by mouth 2 (two) times daily.    [provider]  emtricitabine-tenofovir AF (DESCOVY) 200-25 MG tablet Take 1 tablet by mouth daily. 11/06/23   Jennette Kettle, RPH-CPP  Eszopiclone 3 MG TABS Take 3 mg by mouth at bedtime. Take immediately before bedtime     [provider]  Omega-3 Fatty Acids (OMEGA III EPA+DHA) 1000 MG CAPS Take by mouth. 08/26/18   [provider]  valsartan-hydrochlorothiazide (DIOVAN-HCT) 320-25 MG tablet Take 1 tablet by mouth daily.    [provider]      Allergies    Omeprazole     Review of Systems   Review of Systems  Musculoskeletal:  Positive for back pain.  All other systems reviewed and are negative.   Physical Exam Updated Vital Signs BP (!) 165/95 (BP Location: Right Arm)   Pulse 72   Temp 97.9 F (36.6 C) (Oral)   Resp 20   Ht 6\' 2"  (1.88 m)   Wt 127 kg   SpO2 99%   BMI 35.95 kg/m  Physical Exam Vitals and nursing note reviewed.  Constitutional:      Appearance: He is well-developed.  HENT:     Head: Normocephalic and atraumatic.  Cardiovascular:     Rate and Rhythm: Normal rate and regular rhythm.  Pulmonary:     Effort: Pulmonary effort is normal. No respiratory distress.  Abdominal:     Palpations: Abdomen is soft.     Tenderness: There is no abdominal tenderness. There is no guarding or rebound.  Musculoskeletal:        General: No swelling or tenderness.     Comments: 2+ DP pulses bilaterally.   Skin:    General: Skin is warm and dry.  Neurological:     Mental Status: He is alert and oriented to person, place, and time.     Comments: 5/5 strength in all four extremities, sensation to light touch intact in all four extremities.   Psychiatric:        Behavior: Behavior normal.  ED Results / Procedures / Treatments   Labs (all labs ordered are listed, but only abnormal results are displayed) Labs Reviewed - No data to display  EKG None  Radiology No results found.  Procedures Procedures    Medications Ordered in ED Medications  oxyCODONE (Oxy IR/ROXICODONE) immediate release tablet 5 mg (5 mg Oral Provided for home use 11/14/23 0400)    ED Course/ Medical Decision Making/ A&P                                 Medical Decision Making Risk Prescription drug management.   Patient with history of hypertension, CKD here for evaluation of chronic pain that has had extensive outpatient workup.  He is nontoxic-appearing and well-perfused with symmetric pulses.  Presentation is not consistent with CVA, MS, GBS,  compartment syndrome or rhabdomyolysis.  No evidence of acute infectious process.  Discussed with patient unclear source of his symptoms.  Will provide medication for his pain at this time.  Discussed that he will need additional workup by PCP and may need to be referred to an additional specialist for ongoing evaluation.  Return precautions discussed.        Final Clinical Impression(s) / ED Diagnoses Final diagnoses:  Paresthesia  Body aches    Rx / DC Orders ED Discharge Orders     None         Kelsey Patricia, MD 11/14/23 [phone]

## 2023-11-14 NOTE — ED Triage Notes (Signed)
 Lower back pain that radiates to right leg for ~1 year. Says he has had MRI and still no answers.   Burning pains that intensified yesterday and taking tylenol / ibuprofen with no improvement.

## 2023-11-14 NOTE — Progress Notes (Signed)
 Morphine 4mg /ml vial returned to pharmacy 4/17 by Albina Hull, RN who mistakenly took it home.   Jerri Morale, PharmD, BCPS, BCEMP Clinical Pharmacist Please see AMION for all pharmacy numbers 11/14/2023 2:01 PM

## 2023-11-14 NOTE — Discharge Instructions (Signed)
 Please follow up with your family doctor for further evaluation of your symptoms.  You may need additional work up for rheumatologic process.

## 2023-11-29 ENCOUNTER — Other Ambulatory Visit: Payer: Self-pay

## 2023-11-29 NOTE — Progress Notes (Signed)
 Patient switched to orals and is getting them filled at [facility]

## 2024-01-29 ENCOUNTER — Other Ambulatory Visit: Payer: Self-pay | Admitting: Pharmacist

## 2024-01-29 DIAGNOSIS — Z2981 Encounter for HIV pre-exposure prophylaxis: Secondary | ICD-10-CM

## 2024-01-31 NOTE — Progress Notes (Unsigned)
 Date:  01/31/2024   HPI: Adam Luna is a 53 y.o. male who presents to the [facility] pharmacy clinic for HIV PrEP follow-up.  Insured   [x]    Uninsured  []    There are no active problems to display for this patient.   Patient's Medications  New Prescriptions   No medications on file  Previous Medications   AMLODIPINE (NORVASC) 10 MG TABLET    Take 10 mg by mouth daily.   DULOXETINE (CYMBALTA) 60 MG CAPSULE    Take 60 mg by mouth 2 (two) times daily.   EMTRICITABINE-TENOFOVIR AF (DESCOVY ) 200-25 MG TABLET    Take 1 tablet by mouth daily.   ESZOPICLONE 3 MG TABS    Take 3 mg by mouth at bedtime. Take immediately before bedtime    OMEGA-3 FATTY ACIDS (OMEGA III EPA+DHA) 1000 MG CAPS    Take by mouth.   VALSARTAN-HYDROCHLOROTHIAZIDE (DIOVAN-HCT) 320-25 MG TABLET    Take 1 tablet by mouth daily.  Modified Medications   No medications on file  Discontinued Medications   No medications on file    Allergies: Allergies  Allergen Reactions   Omeprazole Anxiety    Past Medical History: Past Medical History:  Diagnosis Date   ADHD (attention deficit hyperactivity disorder)    Anxiety and depression    Hypertension    OCD (obsessive compulsive disorder)     Social History: Social History   Socioeconomic History   Marital status: Married    Spouse name: Not on file   Number of children: Not on file   Years of education: Not on file   Highest education level: Not on file  Occupational History   Not on file  Tobacco Use   Smoking status: Never   Smokeless tobacco: Never  Substance and Sexual Activity   Alcohol use: No   Drug use: No   Sexual activity: Yes  Other Topics Concern   Not on file  Social History Narrative   Not on file   Social Drivers of Health   Financial Resource Strain: Not on file  Food Insecurity: Not on file  Transportation Needs: Not on file  Physical Activity: Not on file  Stress: Not on file  Social Connections: Not on file       01/25/2023     9:34 AM  CHL HIV PREP FLOWSHEET RESULTS  Insurance Status Insured  How did you hear? referred from internal medicine  Gender at birth Male  Gender identity cis-Male  Risk for HIV Condomless vaginal or anal intercourse  Sex Partners Women only  # sex partners past 3-6 mos 2  Sex activity preferences Insertive;Oral  Condom use No  Treated for STI? Yes  HIV symptoms? None  PrEP Eligibility Yes    Labs:  SCr: Lab Results  Component Value Date   CREATININE 1.67 (H) 03/10/2023   CREATININE 1.30 04/25/2012   HIV Lab Results  Component Value Date   HIV NON-REACTIVE 01/25/2023   Hepatitis B Lab Results  Component Value Date   HEPBSAB REACTIVE (A) 01/25/2023   HEPBSAG NON-REACTIVE 01/25/2023   Hepatitis C No results found for: HEPCAB, HCVRNAPCRQN Hepatitis A Lab Results  Component Value Date   HAV NON-REACTIVE 01/25/2023   RPR and STI Lab Results  Component Value Date   LABRPR NON-REACTIVE 08/20/2023    STI Results GC CT  10/23/2023 10:00 AM Negative    Negative  Negative    Positive   12/06/2021  7:13 PM Negative  Positive  10/06/2016 12:00 AM **POSITIVE**  Negative     Assessment: Seven presents to clinic today for PrEP follow-up. He recently transitioned off of Apretude  back to Descovy  in March due to concerns that Apretude  was contributing to neck/back pain and tingling sensations. States he has been diagnosed with neuropathy and is taking Lyrica for relief. Would like to continue with Descovy  at this time. Denies any missed doses or adverse effects. Screened patient for acute HIV symptoms such as fatigue, muscle aches, rash, sore throat, lymphadenopathy, headache, night sweats, nausea/vomiting/diarrhea, and fever.  Still has ~7 tablets left over. No new issues with [facility] pharmacy. Will check HIV antibody along with BMP and send in refill once it results negative. Will follow BMP every 6 months given CrCl has been right below 90 ml/min.   Urine  cytologies positive in March for chlamydia; completed 7 days of doxycycline  treatment. No new sexual partners since last visit; politely declines STI testing today.  Eligible for 2/2 HAV and 2/2 Shingrix  vaccines which were both administered today. Will check HAV antibodies for immunity at next visit.   Plan: - Check HIV antibody and BMP - If HIV antibody, refill Descovy  x 3 months - Administer 2/2 Havrix and 2/2 Shingrix  vaccines  - Follow-up with me on 10/2 with me    Adam Luna, PharmD, CPP, BCIDP, AAHIVP Clinical Pharmacist Practitioner Infectious Diseases Clinical Pharmacist [facility] 01/31/2024, 12:37 PM

## 2024-02-04 ENCOUNTER — Other Ambulatory Visit: Payer: Self-pay

## 2024-02-04 ENCOUNTER — Ambulatory Visit: Admitting: Pharmacist

## 2024-02-04 DIAGNOSIS — Z23 Encounter for immunization: Secondary | ICD-10-CM | POA: Diagnosis not present

## 2024-02-04 DIAGNOSIS — Z2981 Encounter for HIV pre-exposure prophylaxis: Secondary | ICD-10-CM

## 2024-02-05 ENCOUNTER — Ambulatory Visit: Payer: Self-pay | Admitting: Pharmacist

## 2024-02-05 ENCOUNTER — Other Ambulatory Visit: Payer: Self-pay | Admitting: Pharmacist

## 2024-02-05 DIAGNOSIS — Z2981 Encounter for HIV pre-exposure prophylaxis: Secondary | ICD-10-CM

## 2024-02-05 LAB — BASIC METABOLIC PANEL WITH GFR
BUN/Creatinine Ratio: 13 (calc) (ref 6–22)
BUN: 21 mg/dL (ref 7–25)
CO2: 28 mmol/L (ref 20–32)
Calcium: 9.5 mg/dL (ref 8.6–10.3)
Chloride: 105 mmol/L (ref 98–110)
Creat: 1.66 mg/dL — ABNORMAL HIGH (ref 0.70–1.30)
Glucose, Bld: 87 mg/dL (ref 65–99)
Potassium: 3.4 mmol/L — ABNORMAL LOW (ref 3.5–5.3)
Sodium: 141 mmol/L (ref 135–146)
eGFR: 49 mL/min/1.73m2 — ABNORMAL LOW (ref >=60)

## 2024-02-05 LAB — HIV ANTIBODY (ROUTINE TESTING W REFLEX): HIV 1&2 Ab, 4th Generation: NONREACTIVE

## 2024-02-05 MED ORDER — DESCOVY 200-25 MG PO TABS
1.0000 | ORAL_TABLET | Freq: Every day | ORAL | 2 refills | Status: DC
Start: 2024-02-05 — End: 2024-05-06

## 2024-04-27 NOTE — Progress Notes (Deleted)
 Date:  04/27/2024   HPI: Adam Luna is a 53 y.o. male who presents to the [facility] clinic for HIV PrEP follow-up.  Insured   [x]    Uninsured  []    Referring ID Physician: Dr. Fleeta Rothman  There are no active problems to display for this patient.   Patient's Medications  New Prescriptions   No medications on file  Previous Medications   AMLODIPINE (NORVASC) 10 MG TABLET    Take 10 mg by mouth daily.   DULOXETINE (CYMBALTA) 60 MG CAPSULE    Take 60 mg by mouth 2 (two) times daily.   EMTRICITABINE-TENOFOVIR AF (DESCOVY ) 200-25 MG TABLET    Take 1 tablet by mouth daily.   ESZOPICLONE 3 MG TABS    Take 3 mg by mouth at bedtime. Take immediately before bedtime    OMEGA-3 FATTY ACIDS (OMEGA III EPA+DHA) 1000 MG CAPS    Take by mouth.   VALSARTAN-HYDROCHLOROTHIAZIDE (DIOVAN-HCT) 320-25 MG TABLET    Take 1 tablet by mouth daily.  Modified Medications   No medications on file  Discontinued Medications   No medications on file    Allergies: Allergies  Allergen Reactions   Omeprazole Anxiety    Past Medical History: Past Medical History:  Diagnosis Date   ADHD (attention deficit hyperactivity disorder)    Anxiety and depression    Hypertension    OCD (obsessive compulsive disorder)     Social History: Social History   Socioeconomic History   Marital status: Married    Spouse name: Not on file   Number of children: Not on file   Years of education: Not on file   Highest education level: Not on file  Occupational History   Not on file  Tobacco Use   Smoking status: Never   Smokeless tobacco: Never  Substance and Sexual Activity   Alcohol use: No   Drug use: No   Sexual activity: Yes  Other Topics Concern   Not on file  Social History Narrative   Not on file   Social Drivers of Health   Financial Resource Strain: Not on file  Food Insecurity: Not on file  Transportation Needs: Not on file  Physical Activity: Not on file  Stress: Not on file  Social  Connections: Not on file       01/25/2023    9:34 AM  [facility] HIV PREP FLOWSHEET RESULTS  Insurance Status Insured  How did you hear? referred from internal medicine  Gender at birth Male  Gender identity cis-Male  Risk for HIV Condomless vaginal or anal intercourse  Sex Partners Women only  # sex partners past 3-6 mos 2  Sex activity preferences Insertive;Oral  Condom use No  Treated for STI? Yes  HIV symptoms? None  PrEP Eligibility Yes    Labs:  SCr: Lab Results  Component Value Date   CREATININE 1.66 (H) 02/04/2024   CREATININE 1.67 (H) 03/10/2023   CREATININE 1.30 04/25/2012   HIV Lab Results  Component Value Date   HIV NON-REACTIVE 02/04/2024   HIV NON-REACTIVE 01/25/2023   Hepatitis B Lab Results  Component Value Date   HEPBSAB REACTIVE (A) 01/25/2023   HEPBSAG NON-REACTIVE 01/25/2023   Hepatitis C No results found for: HEPCAB, HCVRNAPCRQN Hepatitis A Lab Results  Component Value Date   HAV NON-REACTIVE 01/25/2023   RPR and STI Lab Results  Component Value Date   LABRPR NON-REACTIVE 08/20/2023    STI Results GC CT  10/23/2023 10:00 AM Negative  Negative  Negative    Positive   12/06/2021  7:13 PM Negative  Positive   10/06/2016 12:00 AM **POSITIVE**  Negative     Assessment: Adam Luna presents to clinic today for PrEP follow-up. Denies any missed doses or adverse effects.  Screened patient for acute HIV symptoms such as fatigue, muscle aches, rash, sore throat, lymphadenopathy, headache, night sweats, nausea/vomiting/diarrhea, and fever.  Last filled Descovy  30-day supply in July (***); till has ~*** tablets left over. No new issues with [facility] pharmacy. Will check HIV antibody and send in refill once it results negative. Follow up in 3 months.  Last STI screening was in March and resulted positive for urinary chlamydia; treated with doxycycline  approrpiately. *** new sexual partners since last visit; will check *** today.  Eligible for  annual flu vaccine which he *** today.   Plan: - Check HIV antibody - If HIV antibody, refill Descovy  x 3 months - Follow-up with me on ***   [name], PharmD, CPP, BCIDP, AAHIVP Clinical Pharmacist Practitioner Infectious Diseases Clinical Pharmacist [facility] 04/27/2024, 3:41 PM

## 2024-04-30 ENCOUNTER — Ambulatory Visit: Admitting: Pharmacist

## 2024-04-30 DIAGNOSIS — Z113 Encounter for screening for infections with a predominantly sexual mode of transmission: Secondary | ICD-10-CM

## 2024-04-30 DIAGNOSIS — Z2981 Encounter for HIV pre-exposure prophylaxis: Secondary | ICD-10-CM

## 2024-05-06 ENCOUNTER — Ambulatory Visit (INDEPENDENT_AMBULATORY_CARE_PROVIDER_SITE_OTHER): Admitting: Pharmacist

## 2024-05-06 ENCOUNTER — Other Ambulatory Visit: Payer: Self-pay

## 2024-05-06 ENCOUNTER — Telehealth: Payer: Self-pay

## 2024-05-06 ENCOUNTER — Other Ambulatory Visit (HOSPITAL_COMMUNITY): Payer: Self-pay

## 2024-05-06 ENCOUNTER — Encounter: Payer: Self-pay | Admitting: Pharmacist

## 2024-05-06 DIAGNOSIS — Z2981 Encounter for HIV pre-exposure prophylaxis: Secondary | ICD-10-CM

## 2024-05-06 DIAGNOSIS — Z23 Encounter for immunization: Secondary | ICD-10-CM | POA: Diagnosis not present

## 2024-05-06 MED ORDER — DESCOVY 200-25 MG PO TABS: 1.0000 | ORAL_TABLET | Freq: Every day | ORAL | 2 refills | Status: DC

## 2024-05-06 NOTE — Progress Notes (Signed)
 Date:  05/06/2024   HPI: Adam Luna is a 53 y.o. male who presents to the [facility] pharmacy clinic for HIV PrEP follow-up.  Insured   [x]    Uninsured  []    Referring ID Physician: Dr. Fleeta Rothman  There are no active problems to display for this patient.   Patient's Medications  New Prescriptions   No medications on file  Previous Medications   AMLODIPINE (NORVASC) 10 MG TABLET    Take 10 mg by mouth daily.   DULOXETINE (CYMBALTA) 60 MG CAPSULE    Take 60 mg by mouth 2 (two) times daily.   EMTRICITABINE-TENOFOVIR AF (DESCOVY ) 200-25 MG TABLET    Take 1 tablet by mouth daily.   ESZOPICLONE 3 MG TABS    Take 3 mg by mouth at bedtime. Take immediately before bedtime    OMEGA-3 FATTY ACIDS (OMEGA III EPA+DHA) 1000 MG CAPS    Take by mouth.   VALSARTAN-HYDROCHLOROTHIAZIDE (DIOVAN-HCT) 320-25 MG TABLET    Take 1 tablet by mouth daily.  Modified Medications   No medications on file  Discontinued Medications   No medications on file    Allergies: Allergies  Allergen Reactions   Omeprazole Anxiety    Past Medical History: Past Medical History:  Diagnosis Date   ADHD (attention deficit hyperactivity disorder)    Anxiety and depression    Hypertension    OCD (obsessive compulsive disorder)     Social History: Social History   Socioeconomic History   Marital status: Married    Spouse name: Not on file   Number of children: Not on file   Years of education: Not on file   Highest education level: Not on file  Occupational History   Not on file  Tobacco Use   Smoking status: Never   Smokeless tobacco: Never  Substance and Sexual Activity   Alcohol use: No   Drug use: No   Sexual activity: Yes  Other Topics Concern   Not on file  Social History Narrative   Not on file   Social Drivers of Health   Financial Resource Strain: Not on file  Food Insecurity: Not on file  Transportation Needs: Not on file  Physical Activity: Not on file  Stress: Not on file  Social  Connections: Not on file       01/25/2023    9:34 AM  [facility] HIV PREP FLOWSHEET RESULTS  Insurance Status Insured  How did you hear? referred from internal medicine  Gender at birth Male  Gender identity cis-Male  Risk for HIV Condomless vaginal or anal intercourse  Sex Partners Women only  # sex partners past 3-6 mos 2  Sex activity preferences Insertive;Oral  Condom use No  Treated for STI? Yes  HIV symptoms? None  PrEP Eligibility Yes    Labs:  SCr: Lab Results  Component Value Date   CREATININE 1.66 (H) 02/04/2024   CREATININE 1.67 (H) 03/10/2023   CREATININE 1.30 04/25/2012   HIV Lab Results  Component Value Date   HIV NON-REACTIVE 02/04/2024   HIV NON-REACTIVE 01/25/2023   Hepatitis B Lab Results  Component Value Date   HEPBSAB REACTIVE (A) 01/25/2023   HEPBSAG NON-REACTIVE 01/25/2023   Hepatitis C No results found for: HEPCAB, HCVRNAPCRQN Hepatitis A Lab Results  Component Value Date   HAV NON-REACTIVE 01/25/2023   RPR and STI Lab Results  Component Value Date   LABRPR NON-REACTIVE 08/20/2023    STI Results GC CT  10/23/2023 10:00 AM Negative  Negative  Negative    Positive   12/06/2021  7:13 PM Negative  Positive   10/06/2016 12:00 AM **POSITIVE**  Negative     Assessment: Adam Luna presents to clinic today for PrEP follow-up. Denies any missed doses or adverse effects.  Screened patient for acute HIV symptoms such as fatigue, muscle aches, rash, sore throat, lymphadenopathy, headache, night sweats, nausea/vomiting/diarrhea, and fever.  Last filled Descovy  30-day supply on 04/01/24; till has ~4 tablets left over. No new issues with [facility] pharmacy. Will check HIV antibody and send in refills today given running low on supply. Follow up in 3 months. PCP has been closely following and managing lipid panel and kidney function.  Last STI screening was in March and resulted positive for urinary chlamydia; treated with doxycycline  appropriately. No  new sexual partners since last visit; politely declines STI testing today.  Eligible for annual flu and COVID vaccines along with PCV20 vaccine today. Accepts PCV20 vaccine and will seek COVID and flu vaccinations later this fall through external pharmacies. Rechecking HAV antibody today to ensure immunity after completing vaccination.  Discussed newly approved long-acting PrEP option Yeztugo today. Patient is interested in this option as it is only twice yearly. Will investigate insurance coverage and discuss with him further after that.   Plan: - Check HIV antibody and HAV antibody - Administer PCV20 vaccine  - Refill Descovy  x 3 months - Follow-up with me on 08/05/24  - Investigate Yeztugo coverage   Adam Luna, PharmD, CPP, BCIDP, AAHIVP Clinical Pharmacist Practitioner Infectious Diseases Clinical Pharmacist [facility] 05/06/2024, 9:06 AM

## 2024-05-06 NOTE — Addendum Note (Signed)
 Addended by: WADDELL ALAN PARAS on: 05/06/2024 09:44 AM   Modules accepted: Orders

## 2024-05-06 NOTE — Progress Notes (Signed)
 Addendum: Unable to check HAV antibody due to not covered through insurance.

## 2024-05-06 NOTE — Patient Instructions (Signed)
 New PrEP Injection (twice yearly) - Yeztugo (lenacapavir)

## 2024-05-06 NOTE — Telephone Encounter (Addendum)
 Pharmacy Patient Advocate Encounter  Insurance verification completed.   The patient is insured through [employer]   Ran test claim for [facility] Tablets & Injection. Currently a quantity of 4 is a 15 day supply and 3ml is a 180 day supply the co-pay is $0.00 . Scripts can be filled at [facility].  [facility] [employer] [facility] and Pharmacy ID  Medical ID -[id number] F3 Payor ID- [id number]  Group- [id number] Pharmacy ID- [id number] RX Group- [id number] BIN- [id number]  This test claim was processed through [facility]- copay amounts may vary at other pharmacies due to pharmacy/plan contracts, or as the patient moves through the different stages of their insurance plan.

## 2024-05-07 LAB — HIV ANTIBODY (ROUTINE TESTING W REFLEX)
HIV 1&2 Ab, 4th Generation: NONREACTIVE
HIV FINAL INTERPRETATION: NEGATIVE

## 2024-05-14 NOTE — Telephone Encounter (Signed)
 Reached out to patient via phone and MyChart. Read message and has not responded or returned call. Will leave it up to patient should he wish to start the new injections. Thank you!

## 2024-07-28 ENCOUNTER — Emergency Department (HOSPITAL_COMMUNITY)
Admission: EM | Admit: 2024-07-28 | Discharge: 2024-07-28 | Discharge status: Against Medical Advice | Attending: Emergency Medicine | Admitting: Emergency Medicine

## 2024-07-28 ENCOUNTER — Other Ambulatory Visit: Payer: Self-pay

## 2024-07-28 ENCOUNTER — Encounter (HOSPITAL_COMMUNITY): Payer: Self-pay

## 2024-07-28 DIAGNOSIS — R1084 Generalized abdominal pain: Secondary | ICD-10-CM | POA: Diagnosis present

## 2024-07-28 DIAGNOSIS — Z5321 Procedure and treatment not carried out due to patient leaving prior to being seen by health care provider: Secondary | ICD-10-CM | POA: Diagnosis not present

## 2024-07-28 LAB — COMPREHENSIVE METABOLIC PANEL WITH GFR
ALT: 49 U/L — ABNORMAL HIGH (ref 0–44)
AST: 47 U/L — ABNORMAL HIGH (ref 15–41)
Albumin: 4.8 g/dL (ref 3.5–5.0)
Alkaline Phosphatase: 72 U/L (ref 38–126)
Anion gap: 13 (ref 5–15)
BUN: 22 mg/dL — ABNORMAL HIGH (ref 6–20)
CO2: 23 mmol/L (ref 22–32)
Calcium: 10.3 mg/dL (ref 8.9–10.3)
Chloride: 106 mmol/L (ref 98–111)
Creatinine, Ser: 1.49 mg/dL — ABNORMAL HIGH (ref 0.61–1.24)
GFR, Estimated: 56 mL/min — ABNORMAL LOW
Glucose, Bld: 119 mg/dL — ABNORMAL HIGH (ref 70–99)
Potassium: 3.7 mmol/L (ref 3.5–5.1)
Sodium: 142 mmol/L (ref 135–145)
Total Bilirubin: 0.8 mg/dL (ref 0.0–1.2)
Total Protein: 8.4 g/dL — ABNORMAL HIGH (ref 6.5–8.1)

## 2024-07-28 LAB — CBC WITH DIFFERENTIAL/PLATELET
Abs Immature Granulocytes: 0.01 K/uL (ref 0.00–0.07)
Basophils Absolute: 0 K/uL (ref 0.0–0.1)
Basophils Relative: 0 %
Eosinophils Absolute: 0.1 K/uL (ref 0.0–0.5)
Eosinophils Relative: 1 %
HCT: 47 % (ref 39.0–52.0)
Hemoglobin: 15.6 g/dL (ref 13.0–17.0)
Immature Granulocytes: 0 %
Lymphocytes Relative: 14 %
Lymphs Abs: 0.8 K/uL (ref 0.7–4.0)
MCH: 29.7 pg (ref 26.0–34.0)
MCHC: 33.2 g/dL (ref 30.0–36.0)
MCV: 89.5 fL (ref 80.0–100.0)
Monocytes Absolute: 0.3 K/uL (ref 0.1–1.0)
Monocytes Relative: 4 %
Neutro Abs: 4.6 K/uL (ref 1.7–7.7)
Neutrophils Relative %: 81 %
Platelets: 204 K/uL (ref 150–400)
RBC: 5.25 MIL/uL (ref 4.22–5.81)
RDW: 13.4 % (ref 11.5–15.5)
WBC: 5.7 K/uL (ref 4.0–10.5)
nRBC: 0 % (ref 0.0–0.2)

## 2024-07-28 LAB — URINALYSIS, ROUTINE W REFLEX MICROSCOPIC
Bilirubin Urine: NEGATIVE
Glucose, UA: NEGATIVE mg/dL
Hgb urine dipstick: NEGATIVE
Ketones, ur: NEGATIVE mg/dL
Nitrite: NEGATIVE
Protein, ur: 30 mg/dL — AB
Specific Gravity, Urine: 1.034 — ABNORMAL HIGH (ref 1.005–1.030)
WBC, UA: >50 WBC/hpf (ref 0–5)
pH: 6 (ref 5.0–8.0)

## 2024-07-28 LAB — LIPASE, BLOOD: Lipase: 40 U/L (ref 11–51)

## 2024-07-28 NOTE — ED Notes (Signed)
 Patient called for vitals at this time. No response heard from waiting room.

## 2024-07-28 NOTE — ED Notes (Signed)
 Patient called for vitals. No response heard from waiting room.

## 2024-07-28 NOTE — ED Triage Notes (Addendum)
 Arrives [facility] with generalized abdominal pain that manifested tonight after eating [employer].   Subjective abdominal distention.  Later reports yellowish discharge from penis.

## 2024-07-29 ENCOUNTER — Encounter (HOSPITAL_COMMUNITY): Payer: Self-pay | Admitting: *Deleted

## 2024-07-29 ENCOUNTER — Ambulatory Visit (HOSPITAL_COMMUNITY): Payer: Self-pay

## 2024-07-29 ENCOUNTER — Ambulatory Visit (HOSPITAL_COMMUNITY): Admission: EM | Admit: 2024-07-29 | Discharge: 2024-07-29 | Disposition: A | Discharge status: Home / Self Care

## 2024-07-29 ENCOUNTER — Ambulatory Visit: Payer: Self-pay

## 2024-07-29 DIAGNOSIS — A749 Chlamydial infection, unspecified: Secondary | ICD-10-CM

## 2024-07-29 DIAGNOSIS — A549 Gonococcal infection, unspecified: Secondary | ICD-10-CM | POA: Diagnosis not present

## 2024-07-29 LAB — GC/CHLAMYDIA PROBE AMP (~~LOC~~) NOT AT ARMC
Chlamydia: POSITIVE — AB
Comment: NEGATIVE
Comment: NORMAL
Neisseria Gonorrhea: POSITIVE — AB

## 2024-07-29 MED ORDER — DOXYCYCLINE HYCLATE 100 MG PO TABS: 100.0000 mg | ORAL_TABLET | Freq: Two times a day (BID) | ORAL | 0 refills | Status: AC

## 2024-07-29 MED ORDER — CEFTRIAXONE SODIUM 500 MG IJ SOLR
500.0000 mg | INTRAMUSCULAR | Status: DC
  Administered 2024-07-29: 500 mg via INTRAMUSCULAR

## 2024-07-29 MED ORDER — CEFTRIAXONE SODIUM 500 MG IJ SOLR
INTRAMUSCULAR | Status: AC
  Filled 2024-07-29: qty 500

## 2024-07-29 MED ORDER — LIDOCAINE HCL (PF) 1 % IJ SOLN
INTRAMUSCULAR | Status: AC
  Filled 2024-07-29: qty 2

## 2024-07-29 NOTE — ED Provider Notes (Addendum)
 " [facility]    CSN: [id number] Arrival date & time: 07/29/24  0818      History   Chief Complaint Chief Complaint  Patient presents with   SEXUALLY TRANSMITTED DISEASE    HPI Adam Luna is a 53 y.o. male.   Adam Luna presents today with complaint of STI.  He reports that he was seen in the emergency room on December 30 where he tested positive for gonorrhea and chlamydia.  He reports that he left prior to being seen by the provider due to long wait time.  He reports that 4 days ago he developed dysuria and purulent urethral discharge that have been unchanged since onset.  This followed an encounter with a new sexual partner.  He has not been having any penile discomfort outside of urination, denies frequency, urgency, skin lesions, testicular pain and swelling, abdominal pain, difficulty with urination.  The history is provided by the patient.    Past Medical History:  Diagnosis Date   ADHD (attention deficit hyperactivity disorder)    Anxiety and depression    Hypertension    OCD (obsessive compulsive disorder)     There are no active problems to display for this patient.   Past Surgical History:  Procedure Laterality Date   KNEE SURGERY     VASECTOMY         Home Medications    Prior to Admission medications  Medication Sig Start Date End Date Taking? Authorizing Provider  amLODipine (NORVASC) 10 MG tablet Take 10 mg by mouth daily.   Yes [provider]  Capsaicin 0.1 % CREA Apply 1 Application topically. 11/22/23  Yes [provider]  doxycycline  (VIBRA -TABS) 100 MG tablet Take 1 tablet (100 mg total) by mouth 2 (two) times daily for 7 days. 07/29/24 08/05/24 Yes Adam Vernell HERO, NP  DULoxetine (CYMBALTA) 60 MG capsule Take 60 mg by mouth 2 (two) times daily.   Yes [provider]  emtricitabine-tenofovir AF (DESCOVY ) 200-25 MG tablet Take 1 tablet by mouth daily. 05/06/24  Yes Adam Luna, RPH-CPP  Eszopiclone 3 MG  TABS Take 3 mg by mouth at bedtime. Take immediately before bedtime    Yes [provider]  lidocaine  (LIDODERM ) 5 % 1 patch daily. 04/01/24  Yes [provider]  Multiple Vitamin (MULTI-VITAMIN) tablet Take 1 tablet by mouth daily. 08/26/18  Yes [provider]  Omega-3 Fatty Acids (OMEGA III EPA+DHA) 1000 MG CAPS Take by mouth. 08/26/18  Yes [provider]  pregabalin (LYRICA) 150 MG capsule Take 150 mg by mouth 3 (three) times daily. 07/29/24 10/27/24 Yes [provider]  valsartan-hydrochlorothiazide (DIOVAN-HCT) 320-25 MG tablet Take 1 tablet by mouth daily.   Yes [provider]  cloNIDine (CATAPRES) 0.1 MG tablet Take 0.1 mg by mouth daily as needed.    [provider]  nebivolol (BYSTOLIC) 10 MG tablet Take 10 mg by mouth daily.    [provider]  olmesartan-hydrochlorothiazide (BENICAR HCT) 40-25 MG tablet Take 1 tablet by mouth daily.    [provider]  simvastatin (ZOCOR) 20 MG tablet Take 20 mg by mouth at bedtime.    [provider]  traZODone (DESYREL) 50 MG tablet Take 50 mg by mouth at bedtime as needed.    [provider]  valACYclovir (VALTREX) 1000 MG tablet Take by mouth.    [provider]  ZEPBOUND 15 MG/0.5ML Pen Inject 15 mg into the skin once a week.    [provider]    Family History Family History  Problem Relation Age of Onset   Hypertension Father     Social History Social History[1]   Allergies   Omeprazole   Review of Systems Review of Systems  Constitutional: Negative.   Genitourinary:  Positive for dysuria and penile discharge. Negative for decreased urine volume, difficulty urinating, flank pain, frequency, genital sores, hematuria, penile pain, penile swelling, scrotal swelling, testicular pain and urgency.  Musculoskeletal:  Negative for myalgias.     Physical Exam Triage Vital Signs ED Triage Vitals  Encounter Vitals Group      BP 07/29/24 0918 126/78     Girls Systolic BP Percentile --      Girls Diastolic BP Percentile --      Boys Systolic BP Percentile --      Boys Diastolic BP Percentile --      Pulse Rate 07/29/24 0918 69     Resp 07/29/24 0918 18     Temp 07/29/24 0918 98 F (36.7 C)     Temp Source 07/29/24 0918 Oral     SpO2 07/29/24 0918 97 %     Weight --      Height --      Head Circumference --      Peak Flow --      Pain Score 07/29/24 0919 1     Pain Loc --      Pain Education --      Exclude from Growth Chart --    No data found.  Updated Vital Signs BP 126/78 (BP Location: Left Arm)   Pulse 69   Temp 98 F (36.7 C) (Oral)   Resp 18   SpO2 97%   Visual Acuity Right Eye Distance:   Left Eye Distance:   Bilateral Distance:    Right Eye Near:   Left Eye Near:    Bilateral Near:     Physical Exam Vitals and nursing note reviewed.  Constitutional:      General: He is not in acute distress.    Appearance: Normal appearance. He is normal weight. He is not toxic-appearing.  Eyes:     Conjunctiva/sclera: Conjunctivae normal.  Cardiovascular:     Rate and Rhythm: Normal rate and regular rhythm.     Heart sounds: Normal heart sounds.  Pulmonary:     Effort: Pulmonary effort is normal.     Breath sounds: Normal breath sounds and air entry.  Abdominal:     General: Abdomen is flat. Bowel sounds are normal.     Palpations: Abdomen is soft.     Tenderness: There is no abdominal tenderness. There is no right CVA tenderness or left CVA tenderness.  Skin:    General: Skin is warm and dry.  Neurological:     Mental Status: He is alert and oriented to person, place, and time.  Psychiatric:        Mood and Affect: Mood normal.        Behavior: Behavior normal.      UC Treatments / Results  Labs (all labs ordered are listed, but only abnormal results are displayed) Labs Reviewed - No data to display Results for orders placed or performed during the hospital encounter of  07/28/24 (from the past 72 hours)  Comprehensive metabolic panel     Status: Abnormal   Collection Time: 07/28/24  5:47 AM  Result Value Ref Range   Sodium 142 135 - 145 mmol/L   Potassium 3.7 3.5 - 5.1 mmol/L  Chloride 106 98 - 111 mmol/L   CO2 23 22 - 32 mmol/L   Glucose, Bld 119 (H) 70 - 99 mg/dL    Comment: Glucose reference range applies only to samples taken after fasting for at least 8 hours.   BUN 22 (H) 6 - 20 mg/dL   Creatinine, Ser 8.50 (H) 0.61 - 1.24 mg/dL   Calcium 89.6 8.9 - 89.6 mg/dL   Total Protein 8.4 (H) 6.5 - 8.1 g/dL   Albumin 4.8 3.5 - 5.0 g/dL   AST 47 (H) 15 - 41 U/L   ALT 49 (H) 0 - 44 U/L   Alkaline Phosphatase 72 38 - 126 U/L   Total Bilirubin 0.8 0.0 - 1.2 mg/dL   GFR, Estimated 56 (L) >60 mL/min    Comment: (NOTE) Calculated using the CKD-EPI Creatinine Equation (2021)    Anion gap 13 5 - 15    Comment: Performed at [facility], [address], [place], [place] [address]  Lipase, blood     Status: None   Collection Time: 07/28/24  5:47 AM  Result Value Ref Range   Lipase 40 11 - 51 U/L    Comment: Performed at [facility], [address] [place], [place] [address]  CBC with Diff     Status: None   Collection Time: 07/28/24  5:47 AM  Result Value Ref Range   WBC 5.7 4.0 - 10.5 K/uL   RBC 5.25 4.22 - 5.81 MIL/uL   Hemoglobin 15.6 13.0 - 17.0 g/dL   HCT 52.9 60.9 - 47.9 %   MCV 89.5 80.0 - 100.0 fL   MCH 29.7 26.0 - 34.0 pg   MCHC 33.2 30.0 - 36.0 g/dL   RDW 86.5 88.4 - 84.4 %   Platelets 204 150 - 400 K/uL   nRBC 0.0 0.0 - 0.2 %   Neutrophils Relative % 81 %   Neutro Abs 4.6 1.7 - 7.7 K/uL   Lymphocytes Relative 14 %   Lymphs Abs 0.8 0.7 - 4.0 K/uL   Monocytes Relative 4 %   Monocytes Absolute 0.3 0.1 - 1.0 K/uL   Eosinophils Relative 1 %   Eosinophils Absolute 0.1 0.0 - 0.5 K/uL   Basophils Relative 0 %   Basophils Absolute 0.0 0.0 - 0.1 K/uL   Immature Granulocytes 0 %   Abs Immature  Granulocytes 0.01 0.00 - 0.07 K/uL    Comment: Performed at [facility], [address] [place], [place] [address]  Urinalysis, Routine w reflex microscopic -Urine, Clean Catch     Status: Abnormal   Collection Time: 07/28/24  5:47 AM  Result Value Ref Range   Color, Urine YELLOW YELLOW   APPearance HAZY (A) CLEAR   Specific Gravity, Urine 1.034 (H) 1.005 - 1.030   pH 6.0 5.0 - 8.0   Glucose, UA NEGATIVE NEGATIVE mg/dL   Hgb urine dipstick NEGATIVE NEGATIVE   Bilirubin Urine NEGATIVE NEGATIVE   Ketones, ur NEGATIVE NEGATIVE mg/dL   Protein, ur 30 (A) NEGATIVE mg/dL   Nitrite NEGATIVE NEGATIVE   Leukocytes,Ua LARGE (A) NEGATIVE   RBC / HPF 6-10 0 - 5 RBC/hpf   WBC, UA >50 0 - 5 WBC/hpf   Bacteria, UA RARE (A) NONE SEEN   Squamous Epithelial / HPF 0-5 0 - 5 /HPF   Mucus PRESENT     Comment: Performed at [facility], [address], [place], [place] [address]  GC/Chlamydia probe amp [facility]) not  at [facility]     Status: Abnormal   Collection Time: 07/28/24  5:55 AM  Result Value Ref Range   Neisseria Gonorrhea Positive (A)    Chlamydia Positive (A)    Comment Normal Reference Ranger Chlamydia - Negative    Comment      Normal Reference Range Neisseria Gonorrhea - Negative    EKG   Radiology No results found.  Procedures Procedures (including critical care time)  Medications Ordered in UC Medications  cefTRIAXone  (ROCEPHIN ) injection 500 mg (500 mg Intramuscular Given 07/29/24 1017)    Initial Impression / Assessment and Plan / UC Course  I have reviewed the triage vital signs and the nursing notes.  Pertinent labs & imaging results that were available during my care of the patient were reviewed by me and considered in my medical decision making (see chart for details).     Gonorrhea and Chlamydia  Confirmed via laboratory testing and ED on December 30.  Treated today in clinic.  500 mg of IM Rocephin  given.  Prescription for  doxycycline  100 mg twice a day for 7 days sent to pharmacy.  Patient understands to complete antibiotic as prescribed and abstain from sexual intercourse for 7 days after last dose of doxycycline .  He will notify his previous partner of positive test result so that they may get tested and treated as well Final Clinical Impressions(s) / UC Diagnoses   Final diagnoses:  Gonorrhea  Chlamydia     Discharge Instructions      Sexually Transmitted Infection (STI): Gonorrhea and Chlamydia -  What this is Gonorrhea and chlamydia are common sexually transmitted bacterial infections. They are spread through vaginal, oral, or anal sex. Many people have mild or no symptoms, but untreated infections can lead to serious complications. Common symptoms Burning with urination, abnormal vaginal or penile discharge, pelvic or testicular pain, bleeding between periods, rectal pain or discharge, and sore throat after oral exposure. Some people have no symptoms. Expected recovery Symptoms often improve within a few days after treatment. Even if you feel better, the infection may still be present until all treatment is completed. Treatment plan Ceftriaxone  (given in clinic) This antibiotic treats gonorrhea. No additional doses are needed unless instructed. Mild soreness at the injection site is common. Doxycycline  (sent to pharmacy) This antibiotic treats chlamydia. Take twice daily for 7 days as prescribed. Take with a full glass of water  and remain upright for at least 30 minutes. Avoid sun exposure or use sunscreen due to photosensitivity. Common side effects include nausea, diarrhea, and stomach upset. Infection control and safe sex practices Do not have sex (oral, vaginal, or anal) until 7 days after completing doxycycline  and symptoms have resolved. All sexual partners from the past 60 days need testing and treatment. Use condoms consistently to reduce future risk. Avoid sharing sex toys or ensure they  are cleaned between uses. Symptom management and home care Drink plenty of fluids. Avoid sexual activity during treatment. Take medications exactly as prescribed and do not skip doses. Call your clinic if Symptoms are not improving after treatment, you develop pelvic or testicular pain, fever, vomiting, or medication side effects that prevent completion of therapy. Go to the Emergency Department if Severe pelvic or abdominal pain, high fever, vomiting with inability to keep medications down, testicular swelling with severe pain, or signs of an allergic reaction such as trouble breathing, facial swelling, or hives.    ED Prescriptions     Medication Sig Dispense Auth. Provider   doxycycline  (  VIBRA -TABS) 100 MG tablet Take 1 tablet (100 mg total) by mouth 2 (two) times daily for 7 days. 14 tablet Adam Vernell HERO, NP      PDMP not reviewed this encounter.    Adam Vernell HERO, NP 07/29/24 1025     [1]  Social History Tobacco Use   Smoking status: Never   Smokeless tobacco: Never  Vaping Use   Vaping status: Never Used  Substance Use Topics   Alcohol use: No   Drug use: No     Adam Vernell HERO, NP 07/29/24 1026  "

## 2024-07-29 NOTE — Discharge Instructions (Signed)
 Sexually Transmitted Infection (STI): Gonorrhea and Chlamydia -  What this is Gonorrhea and chlamydia are common sexually transmitted bacterial infections. They are spread through vaginal, oral, or anal sex. Many people have mild or no symptoms, but untreated infections can lead to serious complications. Common symptoms Burning with urination, abnormal vaginal or penile discharge, pelvic or testicular pain, bleeding between periods, rectal pain or discharge, and sore throat after oral exposure. Some people have no symptoms. Expected recovery Symptoms often improve within a few days after treatment. Even if you feel better, the infection may still be present until all treatment is completed. Treatment plan Ceftriaxone  (given in clinic) This antibiotic treats gonorrhea. No additional doses are needed unless instructed. Mild soreness at the injection site is common. Doxycycline  (sent to pharmacy) This antibiotic treats chlamydia. Take twice daily for 7 days as prescribed. Take with a full glass of water  and remain upright for at least 30 minutes. Avoid sun exposure or use sunscreen due to photosensitivity. Common side effects include nausea, diarrhea, and stomach upset. Infection control and safe sex practices Do not have sex (oral, vaginal, or anal) until 7 days after completing doxycycline  and symptoms have resolved. All sexual partners from the past 60 days need testing and treatment. Use condoms consistently to reduce future risk. Avoid sharing sex toys or ensure they are cleaned between uses. Symptom management and home care Drink plenty of fluids. Avoid sexual activity during treatment. Take medications exactly as prescribed and do not skip doses. Call your clinic if Symptoms are not improving after treatment, you develop pelvic or testicular pain, fever, vomiting, or medication side effects that prevent completion of therapy. Go to the Emergency Department if Severe pelvic or abdominal  pain, high fever, vomiting with inability to keep medications down, testicular swelling with severe pain, or signs of an allergic reaction such as trouble breathing, facial swelling, or hives.

## 2024-07-29 NOTE — ED Triage Notes (Addendum)
 Pt reports he was seen at [facility] on 12/30 for abd pain, dysuria, and penile discharge and had labs and was swabbed for STIs. He left before being seen by a provider due to the wait. States his results came back today in [place] and he is positive for GC and chlamydia

## 2024-07-30 ENCOUNTER — Ambulatory Visit (HOSPITAL_COMMUNITY): Payer: Self-pay

## 2024-08-03 NOTE — Progress Notes (Unsigned)
 "  Date:  08/03/2024   HPI: Adam Luna is a 54 y.o. male who presents to the [facility] pharmacy clinic for HIV PrEP follow-up.  Insured   [x]    Uninsured  []    Referring ID Physician: Corean Fireman, NP   There are no active problems to display for this patient.   Patient's Medications  New Prescriptions   No medications on file  Previous Medications   AMLODIPINE (NORVASC) 10 MG TABLET    Take 10 mg by mouth daily.   CAPSAICIN 0.1 % CREA    Apply 1 Application topically.   CLONIDINE (CATAPRES) 0.1 MG TABLET    Take 0.1 mg by mouth daily as needed.   DOXYCYCLINE  (VIBRA -TABS) 100 MG TABLET    Take 1 tablet (100 mg total) by mouth 2 (two) times daily for 7 days.   DULOXETINE (CYMBALTA) 60 MG CAPSULE    Take 60 mg by mouth 2 (two) times daily.   EMTRICITABINE-TENOFOVIR AF (DESCOVY ) 200-25 MG TABLET    Take 1 tablet by mouth daily.   ESZOPICLONE 3 MG TABS    Take 3 mg by mouth at bedtime. Take immediately before bedtime    LIDOCAINE  (LIDODERM ) 5 %    1 patch daily.   MULTIPLE VITAMIN (MULTI-VITAMIN) TABLET    Take 1 tablet by mouth daily.   NEBIVOLOL (BYSTOLIC) 10 MG TABLET    Take 10 mg by mouth daily.   OLMESARTAN-HYDROCHLOROTHIAZIDE (BENICAR HCT) 40-25 MG TABLET    Take 1 tablet by mouth daily.   OMEGA-3 FATTY ACIDS (OMEGA III EPA+DHA) 1000 MG CAPS    Take by mouth.   PREGABALIN (LYRICA) 150 MG CAPSULE    Take 150 mg by mouth 3 (three) times daily.   SIMVASTATIN (ZOCOR) 20 MG TABLET    Take 20 mg by mouth at bedtime.   TRAZODONE (DESYREL) 50 MG TABLET    Take 50 mg by mouth at bedtime as needed.   VALACYCLOVIR (VALTREX) 1000 MG TABLET    Take by mouth.   VALSARTAN-HYDROCHLOROTHIAZIDE (DIOVAN-HCT) 320-25 MG TABLET    Take 1 tablet by mouth daily.   ZEPBOUND 15 MG/0.5ML PEN    Inject 15 mg into the skin once a week.  Modified Medications   No medications on file  Discontinued Medications   No medications on file    Allergies: Allergies[1]  Past Medical History: Past Medical  History:  Diagnosis Date   ADHD (attention deficit hyperactivity disorder)    Anxiety and depression    Hypertension    OCD (obsessive compulsive disorder)     Social History: Social History   Socioeconomic History   Marital status: Married    Spouse name: Not on file   Number of children: Not on file   Years of education: Not on file   Highest education level: Not on file  Occupational History   Not on file  Tobacco Use   Smoking status: Never   Smokeless tobacco: Never  Vaping Use   Vaping status: Never Used  Substance and Sexual Activity   Alcohol use: No   Drug use: No   Sexual activity: Yes  Other Topics Concern   Not on file  Social History Narrative   Not on file   Social Drivers of Health   Tobacco Use: Low Risk (07/28/2024)   Patient History    Smoking Tobacco Use: Never    Smokeless Tobacco Use: Never    Passive Exposure: Not on file  Financial Resource Strain: Not on  file  Food Insecurity: Not on file  Transportation Needs: Not on file  Physical Activity: Not on file  Stress: Not on file  Social Connections: Not on file  Depression (EYV7-0): Not on file  Alcohol Screen: Not on file  Housing: Not on file  Utilities: Not on file  Health Literacy: Not on file       01/25/2023    9:34 AM  [facility] HIV PREP FLOWSHEET RESULTS  Insurance Status Insured  How did you hear? referred from internal medicine  Gender at birth Male  Gender identity cis-Male  Risk for HIV Condomless vaginal or anal intercourse  Sex Partners Women only  # sex partners past 3-6 mos 2  Sex activity preferences Insertive;Oral  Condom use No  Treated for STI? Yes  HIV symptoms? None  PrEP Eligibility Yes    Labs:  SCr: Lab Results  Component Value Date   CREATININE 1.49 (H) 07/28/2024   CREATININE 1.66 (H) 02/04/2024   CREATININE 1.67 (H) 03/10/2023   CREATININE 1.30 04/25/2012   HIV Lab Results  Component Value Date   HIV NON-REACTIVE 05/06/2024   HIV NON-REACTIVE  02/04/2024   HIV NON-REACTIVE 01/25/2023   Hepatitis B Lab Results  Component Value Date   HEPBSAB REACTIVE (A) 01/25/2023   HEPBSAG NON-REACTIVE 01/25/2023   Hepatitis C No results found for: HEPCAB, HCVRNAPCRQN Hepatitis A Lab Results  Component Value Date   HAV NON-REACTIVE 01/25/2023   RPR and STI Lab Results  Component Value Date   LABRPR NON-REACTIVE 08/20/2023    STI Results GC CT  07/28/2024  5:55 AM Positive  Positive   10/23/2023 10:00 AM Negative    Negative  Negative    Positive   12/06/2021  7:13 PM Negative  Positive   10/06/2016 12:00 AM **POSITIVE**  Negative     Assessment: Adam Luna presents to clinic today for PrEP follow-up. Denies any missed doses or adverse effects.  Screened patient for acute HIV symptoms such as fatigue, muscle aches, rash, sore throat, lymphadenopathy, headache, night sweats, nausea/vomiting/diarrhea, and fever.  Still has ~*** tablets left over. No new issues with [facility] pharmacy. Will check HIV antibody and send in refill once it results negative.  Kidney function in ER showed mild improvement from prior labs. LFTs were slightly elevated; will recheck CMP at next office visit to trend.   Tested positive for urinary gonorrhea and chlamydia in the ED on 12/30 and given ceftriaxone  500 mg x 1 in UC on 12/31. Has been taking doxycyline twice daily since then and will finish tomorrow. Will not repeat STI testing today. States his initial symptoms of dysuria and discharge have resolved.   Plan: - Check HIV antibody - If HIV antibody, refill Descovy  x 3 months - Follow-up with me on ***   [name], PharmD, CPP, BCIDP, AAHIVP Clinical Pharmacist Practitioner Infectious Diseases Clinical Pharmacist [facility] 08/03/2024, 2:54 PM      [1]  Allergies Allergen Reactions   Omeprazole Anxiety   "

## 2024-08-05 ENCOUNTER — Ambulatory Visit (INDEPENDENT_AMBULATORY_CARE_PROVIDER_SITE_OTHER): Admitting: Pharmacist

## 2024-08-05 ENCOUNTER — Other Ambulatory Visit: Payer: Self-pay

## 2024-08-05 DIAGNOSIS — Z113 Encounter for screening for infections with a predominantly sexual mode of transmission: Secondary | ICD-10-CM

## 2024-08-05 DIAGNOSIS — Z23 Encounter for immunization: Secondary | ICD-10-CM

## 2024-08-05 DIAGNOSIS — Z2981 Encounter for HIV pre-exposure prophylaxis: Secondary | ICD-10-CM

## 2024-08-05 NOTE — Patient Instructions (Signed)
 ID Clinic Phone #: [phone]  You can always call to set up appointments for STI testing whenever you need!

## 2024-08-06 LAB — HIV ANTIBODY (ROUTINE TESTING W REFLEX)
HIV 1&2 Ab, 4th Generation: NONREACTIVE
HIV FINAL INTERPRETATION: NEGATIVE

## 2024-08-06 LAB — HEPATITIS A ANTIBODY, TOTAL: Hepatitis A AB,Total: REACTIVE — AB

## 2024-08-06 LAB — SYPHILIS: RPR W/REFLEX TO RPR TITER AND TREPONEMAL ANTIBODIES, TRADITIONAL SCREENING AND DIAGNOSIS ALGORITHM: RPR Ser Ql: NONREACTIVE

## 2024-08-07 ENCOUNTER — Other Ambulatory Visit: Payer: Self-pay | Admitting: Pharmacist

## 2024-08-07 DIAGNOSIS — Z2981 Encounter for HIV pre-exposure prophylaxis: Secondary | ICD-10-CM

## 2024-08-07 MED ORDER — DESCOVY 200-25 MG PO TABS: 1.0000 | ORAL_TABLET | Freq: Every day | ORAL | 2 refills | Status: AC

## 2024-11-04 ENCOUNTER — Ambulatory Visit: Payer: Self-pay | Admitting: Pharmacist
